# Patient Record
Sex: Female | Born: 1985 | Race: Black or African American | Hispanic: No | Marital: Single | Smoking: Never smoker
Health system: Southern US, Community
[De-identification: ages and names within clinical notes are randomized; demographics above are authoritative.]

## PROBLEM LIST (undated history)

## (undated) DIAGNOSIS — R7303 Prediabetes: Secondary | ICD-10-CM

## (undated) DIAGNOSIS — R51 Headache: Secondary | ICD-10-CM

## (undated) DIAGNOSIS — R519 Headache, unspecified: Secondary | ICD-10-CM

## (undated) DIAGNOSIS — M419 Scoliosis, unspecified: Secondary | ICD-10-CM

## (undated) DIAGNOSIS — E7800 Pure hypercholesterolemia, unspecified: Secondary | ICD-10-CM

## (undated) DIAGNOSIS — T7840XA Allergy, unspecified, initial encounter: Secondary | ICD-10-CM

## (undated) DIAGNOSIS — N200 Calculus of kidney: Secondary | ICD-10-CM

## (undated) DIAGNOSIS — K259 Gastric ulcer, unspecified as acute or chronic, without hemorrhage or perforation: Secondary | ICD-10-CM

## (undated) DIAGNOSIS — D649 Anemia, unspecified: Secondary | ICD-10-CM

## (undated) DIAGNOSIS — K219 Gastro-esophageal reflux disease without esophagitis: Secondary | ICD-10-CM

## (undated) HISTORY — DX: Prediabetes: R73.03

## (undated) HISTORY — DX: Pure hypercholesterolemia, unspecified: E78.00

## (undated) HISTORY — DX: Gastric ulcer, unspecified as acute or chronic, without hemorrhage or perforation: K25.9

## (undated) HISTORY — DX: Calculus of kidney: N20.0

## (undated) HISTORY — DX: Allergy, unspecified, initial encounter: T78.40XA

---

## 2012-07-01 ENCOUNTER — Ambulatory Visit: Payer: PRIVATE HEALTH INSURANCE

## 2012-07-01 ENCOUNTER — Ambulatory Visit (INDEPENDENT_AMBULATORY_CARE_PROVIDER_SITE_OTHER): Payer: PRIVATE HEALTH INSURANCE | Admitting: Family Medicine

## 2012-07-01 VITALS — BP 117/77 | HR 80 | Temp 98.1°F | Resp 16 | Ht 66.0 in | Wt 189.4 lb

## 2012-07-01 DIAGNOSIS — R1013 Epigastric pain: Secondary | ICD-10-CM

## 2012-07-01 DIAGNOSIS — R51 Headache: Secondary | ICD-10-CM

## 2012-07-01 MED ORDER — SUCRALFATE 1 G PO TABS: 1.0000 g | ORAL_TABLET | Freq: Four times a day (QID) | ORAL | Status: DC

## 2012-07-01 MED ORDER — TRAMADOL HCL 50 MG PO TABS: 50.0000 mg | ORAL_TABLET | Freq: Three times a day (TID) | ORAL | Status: DC | PRN

## 2012-07-01 MED ORDER — GI COCKTAIL ~~LOC~~: 30.0000 mL | Freq: Once | ORAL | Status: DC

## 2012-07-01 NOTE — Progress Notes (Signed)
[facility] [address], [place] [address] [phone]  Date:  07/01/2012   Name:  Catherine [name]   DOB:  [birth year]   MRN:  [id number]  PCP:  No primary provider on file.    Chief Complaint: Abdominal Pain and Headache   History of Present Illness:  Catherine [name] is a 27 y.o. very pleasant female patient who presents with the following:  She has noted pain in her epigastric area which seems to go "right through to her back."  This has been constant for the last 3 days. No cough.  She has noted frequent headaches.  No fever, chills or body aches.    She is eating normally- eating does not change her symptoms.    She has never had this problem in the past.  She is otherwise generally healthy.    LMP ended about 3 days ago.    She notes that she has heaches frequently- maybe 3x a week.  She has noted this problem for about 6 months and admits to taking a BC powder most days of the week.    She has never been a smoker.    There is no problem list on file for this patient.   History reviewed. No pertinent past medical history.  History reviewed. No pertinent past surgical history.  History  Substance Use Topics  . Smoking status: Never Smoker   . Smokeless tobacco: Not on file  . Alcohol Use: No    History reviewed. No pertinent family history.  No Known Allergies  Medication list has been reviewed and updated.  No current outpatient prescriptions on file prior to visit.    Review of Systems:  As per HPI- otherwise negative.   Physical Examination: Filed Vitals:   07/01/12 1318  BP: 117/77  Pulse: 80  Temp: 98.1 F (36.7 C)  Resp: 16   Filed Vitals:   07/01/12 1318  Height: 5\' 6"  (1.676 m)  Weight: 189 lb 6.4 oz (85.911 kg)   Body mass index is 30.57 kg/(m^2). Ideal Body Weight: Weight in (lb) to have BMI = 25: 154.6   GEN: WDWN, NAD, Non-toxic, A & O x 3, overweight but healthy in appearance HEENT:  Atraumatic, Normocephalic. Neck supple. No masses, No LAD. Bilateral TM wnl, oropharynx normal.  PEERL,EOMI.   Ears and Nose: No external deformity. CV: RRR, No M/G/R. No JVD. No thrill. No extra heart sounds. PULM: CTA B, no wheezes, crackles, rhonchi. No retractions. No resp. distress. No accessory muscle use. ABD: S, ND, +BS. No rebound. No HSM. She is tender over her epigastrium.  EXTR: No c/c/e NEURO Normal gait.  PSYCH: Normally interactive. Conversant. Not depressed or anxious appearing.  Calm demeanor.   [facility] reading (PRIMARY) by  Dr. Patsy Lager. Abdominal film: normal CXR 2V: negative  ABDOMEN - 1 VIEW  Comparison: None.  Findings: There is a nonobstructive bowel gas pattern. No supine evidence of free air. No organomegaly or suspicious calcification.  No acute bony abnormality.  IMPRESSION: No acute findings.  CHEST - 2 VIEW  Comparison: None.  Findings: Low lung volumes. Heart size accentuated by the low volumes. No focal opacities or effusions. No acute bony abnormality.  IMPRESSION: Low lung volumes. No acute findings.  GI cocktail: resolved her pain, tenderness in her epigastric area resolved up re- exam  Assessment and Plan: 1. Epigastric pain  DG Chest 2 View, DG Abd 1 View, gi cocktail (Maalox,Lidocaine,Donnatal), sucralfate (CARAFATE) 1 G tablet  2.  Headache  traMADol (ULTRAM) 50 MG tablet   Suspect that [name] has gastritis due to excessive use of BC powders.  Counseled her to stop use of this medication, and will use carafate and a PPI to heal her stomach.  She may use tramadol for HA if necessary.  Suspect she may be having rebound HA and encouraged her to avoid medication use if possible.  If he HA or stomach symptoms are not better in the next few days she is to let me know- Sooner if worse.     Abbe Amsterdam, MD

## 2012-07-01 NOTE — Patient Instructions (Addendum)
Stop taking BC powders!  You should also avoid advil, aleve, ibuprofen and naproxen.   Use the carafate 4x a day for the next 7- 10 days.  Also, purchase some zantac or prilosec at the drug store for your stomach.  If your stomach is not better in the next few days please let us know- Sooner if worse.   Try to avoid taking medication for your headaches as using medication may prolong the headache cycle. However, if you must take something try tylenol.  If tylenol is not helpful you can take a tramadol, but be aware they can cause drowsiness.    Avoid spicy or acidic foods until your stomach is better

## 2012-10-04 ENCOUNTER — Ambulatory Visit (INDEPENDENT_AMBULATORY_CARE_PROVIDER_SITE_OTHER): Payer: PRIVATE HEALTH INSURANCE | Admitting: Physician Assistant

## 2012-10-04 VITALS — BP 108/78 | HR 76 | Temp 98.1°F | Resp 18 | Ht 64.5 in | Wt 189.0 lb

## 2012-10-04 DIAGNOSIS — R05 Cough: Secondary | ICD-10-CM

## 2012-10-04 DIAGNOSIS — J069 Acute upper respiratory infection, unspecified: Secondary | ICD-10-CM

## 2012-10-04 DIAGNOSIS — R059 Cough, unspecified: Secondary | ICD-10-CM

## 2012-10-04 MED ORDER — AZITHROMYCIN 250 MG PO TABS: ORAL_TABLET | ORAL | Status: DC

## 2012-10-04 MED ORDER — IPRATROPIUM BROMIDE 0.06 % NA SOLN: 2.0000 | Freq: Three times a day (TID) | NASAL | Status: DC

## 2012-10-04 MED ORDER — HYDROCODONE-HOMATROPINE 5-1.5 MG/5ML PO SYRP: ORAL_SOLUTION | ORAL | Status: DC

## 2012-10-04 NOTE — Progress Notes (Signed)
Patient ID: Catherine [name] MRN: [id number], DOB: May 09, 1986, 27 y.o. Date of Encounter: 10/04/2012, 10:47 AM  Primary Physician: No primary provider on file.  Chief Complaint:  Chief Complaint  Patient presents with  . cold sxs    2 weeks    HPI: 27 y.o. female presents with a 14 day history of nasal congestion, post nasal drip, sinus pressure, and cough. Afebrile. No chills. Nasal congestion thick and green/yellow. Cough is mildly productive of green/yellow sputum and not associated with time of day. Some shortness of breath and wheezing after coughing. Ears feel full, leading to sensation of muffled hearing. Has tried OTC cold preps without success. No GI complaints. Appetite normal. No nausea, vomiting, or diarrhea. Her sister has been sick with similar symptoms. No recent antibiotics or recent travels.   No leg trauma, sedentary periods, h/o cancer, or tobacco use.  Past Medical History  Diagnosis Date  . Allergy      Home Meds: Prior to Admission medications   Medication Sig Start Date End Date Taking? Authorizing Provider  Fexofenadine-Pseudoephedrine (ALLEGRA-D PO) Take by mouth.   Yes Historical Provider, MD                       sucralfate (CARAFATE) 1 G tablet Take 1 tablet (1 g total) by mouth 4 (four) times daily. 07/01/12   Gwenlyn Found Copland, MD  traMADol (ULTRAM) 50 MG tablet Take 1 tablet (50 mg total) by mouth every 8 (eight) hours as needed for pain. 07/01/12   Pearline Cables, MD    Allergies: No Known Allergies  History   Social History  . Marital Status: Single    Spouse Name: N/A    Number of Children: N/A  . Years of Education: N/A   Occupational History  . Not on file.   Social History Main Topics  . Smoking status: Never Smoker   . Smokeless tobacco: Not on file  . Alcohol Use: No  . Drug Use: No  . Sexually Active: No   Other Topics Concern  . Not on file   Social History Narrative  . No narrative on file     Review of  Systems: Constitutional: negative for chills, fever, night sweats or weight changes HEENT: see above Cardiovascular: negative for chest pain or palpitations Respiratory: positive for cough, SOB, and wheezing. negative for hemoptysis and dyspnea  Abdominal: negative for abdominal pain, nausea, vomiting or diarrhea Dermatological: negative for rash Neurologic: positive for headache   Physical Exam: Blood pressure 108/78, pulse 76, temperature 98.1 F (36.7 C), temperature source Oral, resp. rate 18, height 5' 4.5" (1.638 m), weight 189 lb (85.73 kg), last menstrual period 09/27/2012, SpO2 100.00%., Body mass index is 31.95 kg/(m^2). General: Well developed, well nourished, in no acute distress. Head: Normocephalic, atraumatic, eyes without discharge, sclera non-icteric, nares are congested. Bilateral auditory canals clear, TM's are without perforation, pearly grey with reflective cone of light bilaterally. Serous effusion bilaterally behind TM's. Maxillary sinus TTP. Oral cavity moist, dentition normal. Posterior pharynx with post nasal drip and mild erythema. No peritonsillar abscess or tonsillar exudate. Neck: Supple. No thyromegaly. Full ROM. No lymphadenopathy. Lungs: Coarse breath sounds bilaterally without wheezes, rales, or rhonchi. Breathing is unlabored.  Heart: RRR with S1 S2. No murmurs, rubs, or gallops appreciated. Msk:  Strength and tone normal for age. Extremities: No clubbing or cyanosis. No edema. Neuro: Alert and oriented X 3. Moves all extremities spontaneously. CNII-XII grossly in tact. Psych:  Responds to questions appropriately with a normal affect.     ASSESSMENT AND PLAN:  27 y.o. female with sinobronchitis -Azithromycin 250 MG #6 2 po first day then 1 po next 4 days no RF -Atrovent NS 0.06% 2 sprays each nare bid prn #1 no RF -Hycodan #4oz 1 tsp po q 4-6 hours prn cough no RF SED -Mucinex -Tylenol/Motrin prn -Rest/fluids -RTC precautions -RTC 3-5 days if no  improvement  Signed, Eula Listen, PA-C 10/04/2012 10:47 AM

## 2012-12-15 ENCOUNTER — Ambulatory Visit (INDEPENDENT_AMBULATORY_CARE_PROVIDER_SITE_OTHER): Payer: PRIVATE HEALTH INSURANCE | Admitting: Family Medicine

## 2012-12-15 ENCOUNTER — Ambulatory Visit: Payer: PRIVATE HEALTH INSURANCE

## 2012-12-15 VITALS — BP 105/74 | HR 98 | Temp 98.3°F | Resp 17 | Ht 61.0 in | Wt 187.0 lb

## 2012-12-15 DIAGNOSIS — R079 Chest pain, unspecified: Secondary | ICD-10-CM

## 2012-12-15 DIAGNOSIS — M549 Dorsalgia, unspecified: Secondary | ICD-10-CM

## 2012-12-15 DIAGNOSIS — R0789 Other chest pain: Secondary | ICD-10-CM

## 2012-12-15 DIAGNOSIS — R071 Chest pain on breathing: Secondary | ICD-10-CM

## 2012-12-15 LAB — POCT CBC
Lymph, poc: 2.8 (ref 0.6–3.4)
MCH, POC: 26.9 pg — AB (ref 27–31.2)
MCHC: 30.4 g/dL — AB (ref 31.8–35.4)
MCV: 88.5 fL (ref 80–97)
MID (cbc): 0.4 (ref 0–0.9)
POC LYMPH PERCENT: 53.2 %L — AB (ref 10–50)
Platelet Count, POC: 202 10*3/uL (ref 142–424)
RDW, POC: 15 %
WBC: 5.3 10*3/uL (ref 4.6–10.2)

## 2012-12-15 LAB — POCT SEDIMENTATION RATE: POCT SED RATE: 31 mm/hr — AB (ref 0–22)

## 2012-12-15 MED ORDER — DICLOFENAC SODIUM 75 MG PO TBEC: 75.0000 mg | DELAYED_RELEASE_TABLET | Freq: Two times a day (BID) | ORAL | Status: DC

## 2012-12-15 NOTE — Progress Notes (Signed)
Subjective: 27 year old lady from [place] who is here with complaint of having pain in her upper chest and back. He goes through the chest cavity. She's had pain in the past last year. She denies any injury. She does not know of any straining or stressing. She denies excessive stress at the job or in life. She exercises with some running about once a week. No reflux. No sore throat.  Objective: Mildly overweight African female in no major acute distress. Her throat is clear. Neck supple without significant nodes. Chest is clear to auscultation. Heart regular without murmurs. She has mild chest wall tenderness anteriorly. Abdomen soft without mass or tenderness.  Assessment: Upper back and anterior chest pain, etiology undetermined  Plan: Check CBC, sedimentation rate, chest x-ray  [facility] reading (PRIMARY) by  Dr. Alwyn Ren Normal chest  Results for orders placed in visit on 12/15/12  POCT CBC      Result Value Range   WBC 5.3  4.6 - 10.2 K/uL   Lymph, poc 2.8  0.6 - 3.4   POC LYMPH PERCENT 53.2 (*) 10 - 50 %L   MID (cbc) 0.4  0 - 0.9   POC MID % 7.5  0 - 12 %M   POC Granulocyte 2.1  2 - 6.9   Granulocyte percent 39.3  37 - 80 %G   RBC 4.84  4.04 - 5.48 M/uL   Hemoglobin 13.0  12.2 - 16.2 g/dL   HCT, POC 16.1  09.6 - 47.9 %   MCV 88.5  80 - 97 fL   MCH, POC 26.9 (*) 27 - 31.2 pg   MCHC 30.4 (*) 31.8 - 35.4 g/dL   RDW, POC 04.5     Platelet Count, POC 202  142 - 424 K/uL   MPV 11.5  0 - 99.8 fL   .Assessment: Nonspecific chest wall pain

## 2012-12-15 NOTE — Patient Instructions (Signed)
Take diclofenac one twice daily for pain and inflammation  Return if not improving or if worse at anytime

## 2012-12-17 ENCOUNTER — Encounter: Payer: Self-pay | Admitting: Family Medicine

## 2013-12-13 ENCOUNTER — Ambulatory Visit (INDEPENDENT_AMBULATORY_CARE_PROVIDER_SITE_OTHER): Payer: PRIVATE HEALTH INSURANCE | Admitting: Family Medicine

## 2013-12-13 ENCOUNTER — Encounter: Payer: Self-pay | Admitting: Family Medicine

## 2013-12-13 VITALS — BP 126/88 | HR 81 | Temp 98.0°F | Resp 16 | Ht 65.0 in | Wt 189.0 lb

## 2013-12-13 DIAGNOSIS — Z0000 Encounter for general adult medical examination without abnormal findings: Secondary | ICD-10-CM

## 2013-12-13 DIAGNOSIS — N644 Mastodynia: Secondary | ICD-10-CM

## 2013-12-13 DIAGNOSIS — Z23 Encounter for immunization: Secondary | ICD-10-CM

## 2013-12-13 LAB — CBC
HEMATOCRIT: 38.4 % (ref 36.0–46.0)
HEMOGLOBIN: 12.9 g/dL (ref 12.0–15.0)
MCH: 27.2 pg (ref 26.0–34.0)
MCHC: 33.6 g/dL (ref 30.0–36.0)
MCV: 80.8 fL (ref 78.0–100.0)
Platelets: 213 10*3/uL (ref 150–400)
RBC: 4.75 MIL/uL (ref 3.87–5.11)
RDW: 14 % (ref 11.5–15.5)
WBC: 4.7 10*3/uL (ref 4.0–10.5)

## 2013-12-13 LAB — COMPREHENSIVE METABOLIC PANEL
ALT: 20 U/L (ref 0–35)
AST: 19 U/L (ref 0–37)
Albumin: 4.2 g/dL (ref 3.5–5.2)
Alkaline Phosphatase: 43 U/L (ref 39–117)
BUN: 10 mg/dL (ref 6–23)
CHLORIDE: 106 meq/L (ref 96–112)
CO2: 24 meq/L (ref 19–32)
Calcium: 9.4 mg/dL (ref 8.4–10.5)
Creat: 0.77 mg/dL (ref 0.50–1.10)
GLUCOSE: 81 mg/dL (ref 70–99)
POTASSIUM: 4.4 meq/L (ref 3.5–5.3)
Sodium: 143 mEq/L (ref 135–145)
TOTAL PROTEIN: 7.5 g/dL (ref 6.0–8.3)
Total Bilirubin: 0.3 mg/dL (ref 0.2–1.2)

## 2013-12-13 LAB — TSH: TSH: 1.509 u[IU]/mL (ref 0.350–4.500)

## 2013-12-13 LAB — LIPID PANEL
CHOL/HDL RATIO: 4.8 ratio
Cholesterol: 306 mg/dL — ABNORMAL HIGH (ref 0–200)
HDL: 64 mg/dL (ref >39)
LDL CALC: 229 mg/dL — AB (ref 0–99)
TRIGLYCERIDES: 66 mg/dL (ref <150)
VLDL: 13 mg/dL (ref 0–40)

## 2013-12-13 NOTE — Progress Notes (Signed)
[facility] [address], [place] [address] [phone]  Date:  12/13/2013   Name:  Catherine [name]   DOB:  [birth year]   MRN:  [id number]  PCP:  No PCP Per Patient    Chief Complaint: Annual Exam   History of Present Illness:  Catherine [name] is a 28 y.o. very pleasant female patient who presents with the following:  Generally healthy young woman here today for an annual exam.  She notes breast tenderness for about 6 months.  It is sometimes related to her cycles but not always.  Can be one breast or both, the left is worse.  She had breast discharge as a child, but none more recently.  No known family history of breast cancer.   She would like to do a pap smear- she has never had one in the past.   She is interested in losing some weight. Would like to lose about 20 lbs LMP was 2 or 3 weeks ago.  She has never had intercourse in her life.  She has not had a pelvic exam or used tampons   There are no active problems to display for this patient.   Past Medical History  Diagnosis Date  . Allergy     History reviewed. No pertinent past surgical history.  History  Substance Use Topics  . Smoking status: Never Smoker   . Smokeless tobacco: Not on file  . Alcohol Use: No    Family History  Problem Relation Age of Onset  . Hypertension Mother     No Known Allergies  Medication list has been reviewed and updated.  Current Outpatient Prescriptions on File Prior to Visit  Medication Sig Dispense Refill  . acetaminophen (TYLENOL) 325 MG tablet Take 650 mg by mouth every 6 (six) hours as needed for pain.      Marland Kitchen diclofenac (VOLTAREN) 75 MG EC tablet Take 1 tablet (75 mg total) by mouth 2 (two) times daily.  30 tablet  1   No current facility-administered medications on file prior to visit.    Review of Systems:  As per HPI- otherwise negative.   Physical Examination: Filed Vitals:   12/13/13 1023  BP: 126/88  Pulse: 81  Temp: 98 F  (36.7 C)  Resp: 16   Filed Vitals:   12/13/13 1023  Height: 5\' 5"  (1.651 m)  Weight: 189 lb (85.73 kg)   Body mass index is 31.45 kg/(m^2). Ideal Body Weight: Weight in (lb) to have BMI = 25: 149.9  GEN: WDWN, NAD, Non-toxic, A & O x 3, overweight, looks well HEENT: Atraumatic, Normocephalic. Neck supple. No masses, No LAD.  Bilateral TM wnl, oropharynx normal.  PEERL,EOMI.   Ears and Nose: No external deformity. CV: RRR, No M/G/R. No JVD. No thrill. No extra heart sounds. PULM: CTA B, no wheezes, crackles, rhonchi. No retractions. No resp. distress. No accessory muscle use. ABD: S, NT, ND. No rebound. No HSM. EXTR: No c/c/e NEURO Normal gait.  PSYCH: Normally interactive. Conversant. Not depressed or anxious appearing.  Calm demeanor.  Breast: normal exam, no masses/ dimpling/ discharge Pelvic: normal external genitalia.  Attempted pap but pt had pain with speculum insertion so we stopped   Assessment and Plan: Physical exam - Plan: CBC, Comprehensive metabolic panel, Lipid panel, TSH, Tdap vaccine greater than or equal to 7yo IM  Breast pain - Plan: MM Digital Diagnostic Bilat  CPE as above.  Update tdap.  She is of age for a  pap, but have never been SA and had pain with exam.  She will try using some small tampons and we will try her pap again in a few months.   Breast exam is normal- however given her concern will schedule a mammogram to ensure all is well Will plan further follow- up pending labs.  See patient instructions for more details.     Signed Lamar Blinks, MD

## 2013-12-13 NOTE — Patient Instructions (Signed)
You may want to try using "junior absorbency" tampons to become used to pressure in your vagina.  If this goes well we can try doing a pap for you again in a few months.  I would like to get a pap for you in the next year if possible.   I will be in touch with your labs, and we will set up a mammogram appointment for you and will give you a call.

## 2013-12-14 ENCOUNTER — Other Ambulatory Visit: Payer: Self-pay | Admitting: Family Medicine

## 2013-12-14 DIAGNOSIS — N644 Mastodynia: Secondary | ICD-10-CM

## 2013-12-28 ENCOUNTER — Ambulatory Visit
Admission: RE | Admit: 2013-12-28 | Discharge: 2013-12-28 | Disposition: A | Discharge status: Home / Self Care | Payer: PRIVATE HEALTH INSURANCE | Source: Ambulatory Visit | Attending: Family Medicine | Admitting: Family Medicine

## 2013-12-28 DIAGNOSIS — N644 Mastodynia: Secondary | ICD-10-CM

## 2013-12-29 ENCOUNTER — Encounter: Payer: Self-pay | Admitting: Family Medicine

## 2014-12-13 ENCOUNTER — Ambulatory Visit (INDEPENDENT_AMBULATORY_CARE_PROVIDER_SITE_OTHER): Payer: No Typology Code available for payment source | Admitting: Emergency Medicine

## 2014-12-13 VITALS — BP 102/78 | HR 96 | Temp 98.4°F | Resp 16 | Ht 65.0 in | Wt 183.0 lb

## 2014-12-13 DIAGNOSIS — J0140 Acute pansinusitis, unspecified: Secondary | ICD-10-CM

## 2014-12-13 MED ORDER — ACETAMINOPHEN-CODEINE #3 300-30 MG PO TABS: 1.0000 | ORAL_TABLET | ORAL | Status: DC | PRN

## 2014-12-13 MED ORDER — PSEUDOEPHEDRINE-GUAIFENESIN ER 60-600 MG PO TB12: 1.0000 | ORAL_TABLET | Freq: Two times a day (BID) | ORAL | Status: DC

## 2014-12-13 MED ORDER — AMOXICILLIN-POT CLAVULANATE 875-125 MG PO TABS: 1.0000 | ORAL_TABLET | Freq: Two times a day (BID) | ORAL | Status: DC

## 2014-12-13 NOTE — Progress Notes (Signed)
Subjective:  Patient ID: Catherine [name], female    DOB: 1986/06/02  Age: 29 y.o. MRN: [id number]  CC: Headache   HPI Catherine [name] presents   With headache. Catherine has a headache on the right side of Catherine head. Catherine denies any pulsing or throbbing. Catherine thinks Catherine might have migraine. Catherine denies any blurred vision double vision. Has no history of head injury or antecedent illness. Has no  Blurred or double vision. He has no numbness weakness in Catherine extremities. Has no difficulty expressing Catherine thoughts. Has slurred speech. Facial asymmetry. Catherine has no neck pain or stiffness. No fever or chills. Catherine does have some nasal congestion Catherine drainage in the right side.  History Catherine [name] has a past medical history of Allergy.   Catherine [name].   Catherine  family history includes Hypertension in Catherine [name].  Catherine   reports that Catherine has never [name]. Catherine does not have any smokeless tobacco history on [name]. Catherine reports that Catherine does not drink alcohol or use illicit drugs.  Outpatient Prescriptions Prior to Visit  Medication Sig Dispense Refill  . acetaminophen (TYLENOL) 325 MG tablet Take 650 mg by mouth every 6 (six) hours as needed for pain.    Marland Kitchen diclofenac (VOLTAREN) 75 MG EC tablet Take 1 tablet (75 mg total) by mouth 2 (two) times daily. (Patient not taking: Reported on 12/13/2014) 30 tablet 1   No facility-administered medications prior to visit.    History   Social History  . Marital Status: Single    Spouse Name: N/A  . Number of Children: N/A  . Years of Education: N/A   Social History Main Topics  . Smoking status: Never Smoker   . Smokeless tobacco: Not on [name]  . Alcohol Use: No  . Drug Use: No  . Sexual Activity: No   Other Topics Concern  . None   Social History Narrative     Review of Systems  Constitutional: Negative for fever, chills Catherine appetite change.  HENT: Negative for congestion, ear pain, postnasal drip, sinus pressure Catherine sore  throat.   Eyes: Negative for pain Catherine redness.  Respiratory: Negative for cough, shortness of breath Catherine wheezing.   Cardiovascular: Negative for leg swelling.  Gastrointestinal: Negative for nausea, vomiting, abdominal pain, diarrhea, constipation Catherine blood in stool.  Endocrine: Negative for polyuria.  Genitourinary: Negative for dysuria, urgency, frequency Catherine flank pain.  Musculoskeletal: Negative for gait problem.  Skin: Negative for rash.  Neurological: Positive for headaches. Negative for dizziness, speech difficulty, weakness Catherine numbness.  Psychiatric/Behavioral: Negative for confusion Catherine decreased concentration. The patient is not nervous/anxious.     Objective:  BP 102/78 mmHg  Pulse 96  Temp(Src) 98.4 F (36.9 C) (Oral)  Resp 16  Ht 5\' 5"  (1.651 m)  Wt 183 lb (83.008 kg)  BMI 30.45 kg/m2  SpO2 98%  LMP 12/10/2014  Physical Exam  Constitutional: Catherine is oriented to person, place, Catherine time. Catherine appears well-developed Catherine well-nourished.  HENT:  Head: Normocephalic Catherine atraumatic.  Nose: Mucosal edema Catherine rhinorrhea present.  Eyes: Conjunctivae Catherine EOM are normal. Pupils are equal, round, Catherine reactive to light. Lids are everted Catherine swept, no foreign bodies found.  Pulmonary/Chest: Effort normal.  Musculoskeletal: Catherine exhibits no edema.  Neurological: Catherine is alert Catherine oriented to person, place, Catherine time. Catherine has normal strength. No cranial nerve deficit or sensory deficit. Catherine displays a negative Romberg sign. Coordination Catherine gait normal.  Skin: Skin  is dry.  Psychiatric: Catherine has a normal mood Catherine affect. Catherine behavior is normal. Thought content normal.      Assessment & Plan:   [name] was seen today for headache.  Diagnoses Catherine all orders for this visit:  Acute pansinusitis, recurrence not specified  Other orders -     [name] (AUGMENTIN) 875-125 MG per tablet; Take 1 tablet by mouth 2 (two) times daily. -     Catherine [name]  (MUCINEX D) 60-600 MG per tablet; Take 1 tablet by mouth every 12 (twelve) hours. -     [name] (TYLENOL #3) 300-30 MG per tablet; Take 1-2 tablets by mouth every 4 (four) hours as needed.   Catherine [name], Catherine [name], Catherine [name]. Catherine am also having Catherine maintain Catherine acetaminophen Catherine diclofenac.  Meds ordered this encounter  Medications  . [name] (AUGMENTIN) 875-125 MG per tablet    Sig: Take 1 tablet by mouth 2 (two) times daily.    Dispense:  20 tablet    Refill:  0  . Catherine [name] (MUCINEX D) 60-600 MG per tablet    Sig: Take 1 tablet by mouth every 12 (twelve) hours.    Dispense:  18 tablet    Refill:  0  . [name] (TYLENOL #3) 300-30 MG per tablet    Sig: Take 1-2 tablets by mouth every 4 (four) hours as needed.    Dispense:  20 tablet    Refill:  0    we discussed a number of etiologies for headache Catherine has purulent drainage in Catherine nostrils total we treat Catherine as a sinus infection for the improvement couple days we need to get Catherine imaged. This is not the worst headache of Catherine life.  Appropriate red flag conditions were discussed with the patient as well as actions that should be taken.  Patient expressed his understanding.  Follow-up: Return if symptoms worsen or fail to improve.  Roselee Culver, MD

## 2014-12-13 NOTE — Patient Instructions (Signed)

## 2015-06-17 ENCOUNTER — Ambulatory Visit (INDEPENDENT_AMBULATORY_CARE_PROVIDER_SITE_OTHER): Payer: BLUE CROSS/BLUE SHIELD

## 2015-06-17 ENCOUNTER — Ambulatory Visit (INDEPENDENT_AMBULATORY_CARE_PROVIDER_SITE_OTHER): Payer: BLUE CROSS/BLUE SHIELD | Admitting: Physician Assistant

## 2015-06-17 VITALS — BP 116/70 | HR 86 | Temp 98.3°F | Resp 18 | Ht 65.0 in | Wt 189.0 lb

## 2015-06-17 DIAGNOSIS — R1900 Intra-abdominal and pelvic swelling, mass and lump, unspecified site: Secondary | ICD-10-CM

## 2015-06-17 DIAGNOSIS — R109 Unspecified abdominal pain: Secondary | ICD-10-CM

## 2015-06-17 DIAGNOSIS — Z23 Encounter for immunization: Secondary | ICD-10-CM

## 2015-06-17 LAB — POCT URINALYSIS DIP (MANUAL ENTRY)
BILIRUBIN UA: NEGATIVE
BILIRUBIN UA: NEGATIVE
Blood, UA: NEGATIVE
GLUCOSE UA: NEGATIVE
LEUKOCYTES UA: NEGATIVE
NITRITE UA: NEGATIVE
PROTEIN UA: NEGATIVE
Spec Grav, UA: 1.025
Urobilinogen, UA: 0.2
pH, UA: 6

## 2015-06-17 LAB — POCT CBC
Granulocyte percent: 33.6 %G — AB (ref 37–80)
HCT, POC: 34.8 % — AB (ref 37.7–47.9)
HEMOGLOBIN: 11.5 g/dL — AB (ref 12.2–16.2)
Lymph, poc: 2.8 (ref 0.6–3.4)
MCH, POC: 26.5 pg — AB (ref 27–31.2)
MCHC: 33.1 g/dL (ref 31.8–35.4)
MCV: 80.1 fL (ref 80–97)
MID (CBC): 0.4 (ref 0–0.9)
MPV: 8.8 fL (ref 0–99.8)
POC GRANULOCYTE: 1.6 — AB (ref 2–6.9)
POC LYMPH PERCENT: 57.5 %L — AB (ref 10–50)
POC MID %: 8.9 %M (ref 0–12)
Platelet Count, POC: 196 10*3/uL (ref 142–424)
RBC: 4.34 M/uL (ref 4.04–5.48)
RDW, POC: 16.1 %
WBC: 4.8 10*3/uL (ref 4.6–10.2)

## 2015-06-17 LAB — POC MICROSCOPIC URINALYSIS (UMFC)

## 2015-06-17 LAB — HCG, SERUM, QUALITATIVE: Preg, Serum: NEGATIVE

## 2015-06-17 LAB — POCT URINE PREGNANCY: Preg Test, Ur: NEGATIVE

## 2015-06-17 NOTE — Progress Notes (Signed)
06/17/2015 3:36 PM   DOB: 1985/09/30 / MRN: [id number]  SUBJECTIVE:  Catherine [name] is a 30 y.o. female presenting for Reportabdominal pain that started 1 month ago.  Reports that the pain is on and off, and feels like menstrual cramp. She does not take birth control, and reports the pain is worse with menstruation. She does not consume alcohol. She denies nausea, emesis.  She does report using the bathroom only one last week and thought she may be constipated, however for the last three days she has had normal stools.    She has never had a pap smear and has an appointment with an GYN next month for this.  Denies vaginal discharge and odor.  She reports her periods have been heavier over the last 4-5 months, and she is now five days late.    She has No Known Allergies.   She  has a past medical history of Allergy.    She  reports that she has never smoked. She does not have any smokeless tobacco history on file. She reports that she does not drink alcohol or use illicit drugs. She  reports that she does not engage in sexual activity. The patient  has no past surgical history on file.  Her family history includes Hypertension in her mother.  Review of Systems  Constitutional: Negative for fever and chills.  Eyes: Negative for blurred vision.  Respiratory: Negative for cough and shortness of breath.   Cardiovascular: Negative for chest pain.  Gastrointestinal: Negative for nausea and abdominal pain.  Genitourinary: Negative for dysuria, urgency and frequency.  Musculoskeletal: Negative for myalgias.  Skin: Negative for rash.  Neurological: Negative for dizziness, tingling and headaches.  Psychiatric/Behavioral: Negative for depression. The patient is not nervous/anxious.     Problem list and medications reviewed and updated by myself where necessary, and exist elsewhere in the encounter.   OBJECTIVE:  BP 116/70 mmHg  Pulse 86  Temp(Src) 98.3 F (36.8 C) (Oral)  Resp 18  Ht 5'  5" (1.651 m)  Wt 189 lb (85.73 kg)  BMI 31.45 kg/m2  SpO2 98%  LMP 05/17/2015  Physical Exam  Constitutional: She is oriented to person, place, and time. She appears well-nourished. No distress.  Eyes: EOM are normal. Pupils are equal, round, and reactive to light.  Cardiovascular: Normal rate.   Pulmonary/Chest: Effort normal.  Abdominal: She exhibits no distension.  Neurological: She is alert and oriented to person, place, and time. No cranial nerve deficit. Gait normal.  Skin: Skin is dry. She is not diaphoretic.  Psychiatric: She has a normal mood and affect.  Vitals reviewed.   Results for orders placed or performed in visit on 06/17/15 (from the past 48 hour(s))  POCT CBC     Status: Abnormal   Collection Time: 06/17/15  1:02 PM  Result Value Ref Range   WBC 4.8 4.6 - 10.2 K/uL   Lymph, poc 2.8 0.6 - 3.4   POC LYMPH PERCENT 57.5 (A) 10 - 50 %L   MID (cbc) 0.4 0 - 0.9   POC MID % 8.9 0 - 12 %M   POC Granulocyte 1.6 (A) 2 - 6.9   Granulocyte percent 33.6 (A) 37 - 80 %G   RBC 4.34 4.04 - 5.48 M/uL   Hemoglobin 11.5 (A) 12.2 - 16.2 g/dL   HCT, POC 34.8 (A) 37.7 - 47.9 %   MCV 80.1 80 - 97 fL   MCH, POC 26.5 (A) 27 - 31.2 pg  MCHC 33.1 31.8 - 35.4 g/dL   RDW, POC 16.1 %   Platelet Count, POC 196 142 - 424 K/uL   MPV 8.8 0 - 99.8 fL  POCT urinalysis dipstick     Status: None   Collection Time: 06/17/15  1:02 PM  Result Value Ref Range   Color, UA yellow yellow   Clarity, UA clear clear   Glucose, UA negative negative   Bilirubin, UA negative negative   Ketones, POC UA negative negative   Spec Grav, UA 1.025    Blood, UA negative negative   pH, UA 6.0    Protein Ur, POC negative negative   Urobilinogen, UA 0.2    Nitrite, UA Negative Negative   Leukocytes, UA Negative Negative  POCT urine pregnancy     Status: None   Collection Time: 06/17/15  1:04 PM  Result Value Ref Range   Preg Test, Ur Negative Negative  POCT Microscopic Urinalysis ([facility])     Status:  Abnormal   Collection Time: 06/17/15  1:05 PM  Result Value Ref Range   WBC,UR,HPF,POC None None WBC/hpf   RBC,UR,HPF,POC None None RBC/hpf   Bacteria None None, Too numerous to count   Mucus Present (A) Absent   Epithelial Cells, UR Per Microscopy Few (A) None, Too numerous to count cells/hpf    ASSESSMENT AND PLAN  [place] was seen today for abdominal pain, flank pain and flu vaccine.  Diagnoses and all orders for this visit:  Flank pain: Her urine is unremarkable and there appears to be a non contributory viral process on CBC. She is not pregnant, however given that her uterus feels firm and enlarged will double check this with serum.  Rads negative for a fetus, as well as anything other than stool.  I have advised that she start a fiber regimen. Will ultrasound the mass in her belly sometime this week to learn more.  Advised that she keep her GYN appointment for next month.   -     POCT CBC -     POCT urinalysis dipstick -     POCT Microscopic Urinalysis ([facility]) -     POCT urine pregnancy -     DG Abd 2 Views; Future  Needs flu shot -     Flu Vaccine QUAD 36+ mos IM  Abdominal swelling, mass, or lump -     DG Abd 2 Views; Future -     hCG, serum, qualitative -     US Pelvis Complete; Future    The patient was advised to call or return to clinic if she does not see an improvement in symptoms or to seek the care of the closest emergency department if she worsens with the above plan.   Philis Fendt, MHS, PA-C [facility] 06/17/2015 3:36 PM

## 2015-06-17 NOTE — Patient Instructions (Signed)
Influenza Virus Vaccine injection What is this medicine? INFLUENZA VIRUS VACCINE (in floo EN zuh VAHY ruhs vak SEEN) helps to reduce the risk of getting influenza also known as the flu. The vaccine only helps protect you against some strains of the flu. This medicine may be used for other purposes; ask your health care Mc Bloodworth or pharmacist if you have questions. What should I tell my health care Talor Cheema before I take this medicine? They need to know if you have any of these conditions: -bleeding disorder like hemophilia -fever or infection -Guillain-Barre syndrome or other neurological problems -immune system problems -infection with the human immunodeficiency virus (HIV) or AIDS -low blood platelet counts -multiple sclerosis -an unusual or allergic reaction to influenza virus vaccine, latex, other medicines, foods, dyes, or preservatives. Different brands of vaccines contain different allergens. Some may contain latex or eggs. Talk to your doctor about your allergies to make sure that you get the right vaccine. -pregnant or trying to get pregnant -breast-feeding How should I use this medicine? This vaccine is for injection into a muscle or under the skin. It is given by a health care professional. A copy of Vaccine Information Statements will be given before each vaccination. Read this sheet carefully each time. The sheet may change frequently. Talk to your healthcare Afiya Ferrebee to see which vaccines are right for you. Some vaccines should not be used in all age groups. Overdosage: If you think you have taken too much of this medicine contact a poison control center or emergency room at once. NOTE: This medicine is only for you. Do not share this medicine with others. What if I miss a dose? This does not apply. What may interact with this medicine? -chemotherapy or radiation therapy -medicines that lower your immune system like etanercept, anakinra, infliximab, and adalimumab -medicines  that treat or prevent blood clots like warfarin -phenytoin -steroid medicines like prednisone or cortisone -theophylline -vaccines This list may not describe all possible interactions. Give your health care Kyrra Prada a list of all the medicines, herbs, non-prescription drugs, or dietary supplements you use. Also tell them if you smoke, drink alcohol, or use illegal drugs. Some items may interact with your medicine. What should I watch for while using this medicine? Report any side effects that do not go away within 3 days to your doctor or health care professional. Call your health care Ariza Evans if any unusual symptoms occur within 6 weeks of receiving this vaccine. You may still catch the flu, but the illness is not usually as bad. You cannot get the flu from the vaccine. The vaccine will not protect against colds or other illnesses that may cause fever. The vaccine is needed every year. What side effects may I notice from receiving this medicine? Side effects that you should report to your doctor or health care professional as soon as possible: -allergic reactions like skin rash, itching or hives, swelling of the face, lips, or tongue Side effects that usually do not require medical attention (report to your doctor or health care professional if they continue or are bothersome): -fever -headache -muscle aches and pains -pain, tenderness, redness, or swelling at the injection site -tiredness This list may not describe all possible side effects. Call your doctor for medical advice about side effects. You may report side effects to FDA at 1-800-FDA-1088. Where should I keep my medicine? The vaccine will be given by a health care professional in a clinic, pharmacy, doctor's office, or other health care setting. You will not   be given vaccine doses to store at home. NOTE: This sheet is a summary. It may not cover all possible information. If you have questions about this medicine, talk to your  doctor, pharmacist, or health care Desteni Piscopo.    2016, Elsevier/Gold Standard. (2014-12-21 10:07:28)  

## 2015-06-18 ENCOUNTER — Other Ambulatory Visit: Payer: Self-pay | Admitting: Physician Assistant

## 2015-06-18 DIAGNOSIS — R1900 Intra-abdominal and pelvic swelling, mass and lump, unspecified site: Secondary | ICD-10-CM

## 2015-06-19 ENCOUNTER — Ambulatory Visit
Admission: RE | Admit: 2015-06-19 | Discharge: 2015-06-19 | Disposition: A | Discharge status: Home / Self Care | Payer: BLUE CROSS/BLUE SHIELD | Source: Ambulatory Visit | Attending: Physician Assistant | Admitting: Physician Assistant

## 2015-06-19 DIAGNOSIS — R1900 Intra-abdominal and pelvic swelling, mass and lump, unspecified site: Secondary | ICD-10-CM

## 2015-06-26 ENCOUNTER — Other Ambulatory Visit: Payer: Self-pay | Admitting: Physician Assistant

## 2015-06-26 DIAGNOSIS — D259 Leiomyoma of uterus, unspecified: Secondary | ICD-10-CM

## 2015-06-26 NOTE — Progress Notes (Signed)
Please go ahead and put in the referral.

## 2015-06-26 NOTE — Progress Notes (Signed)
Spoke with patient to see if she had any questions.  She has none and would like to be referred to GYN. Philis Fendt, MS, PA-C 1:29 PM, 06/26/2015

## 2015-07-02 ENCOUNTER — Ambulatory Visit: Payer: BLUE CROSS/BLUE SHIELD | Admitting: Gynecology

## 2015-07-02 ENCOUNTER — Ambulatory Visit (INDEPENDENT_AMBULATORY_CARE_PROVIDER_SITE_OTHER): Payer: BLUE CROSS/BLUE SHIELD | Admitting: Gynecology

## 2015-07-02 ENCOUNTER — Other Ambulatory Visit (HOSPITAL_COMMUNITY)
Admission: RE | Admit: 2015-07-02 | Discharge: 2015-07-02 | Disposition: A | Discharge status: Home / Self Care | Payer: BLUE CROSS/BLUE SHIELD | Source: Ambulatory Visit | Attending: Gynecology | Admitting: Gynecology

## 2015-07-02 ENCOUNTER — Encounter: Payer: Self-pay | Admitting: Gynecology

## 2015-07-02 VITALS — BP 118/74 | Ht 65.0 in | Wt 188.0 lb

## 2015-07-02 DIAGNOSIS — Z01419 Encounter for gynecological examination (general) (routine) without abnormal findings: Secondary | ICD-10-CM | POA: Insufficient documentation

## 2015-07-02 DIAGNOSIS — D251 Intramural leiomyoma of uterus: Secondary | ICD-10-CM | POA: Diagnosis not present

## 2015-07-02 DIAGNOSIS — N920 Excessive and frequent menstruation with regular cycle: Secondary | ICD-10-CM | POA: Diagnosis not present

## 2015-07-02 DIAGNOSIS — Z1151 Encounter for screening for human papillomavirus (HPV): Secondary | ICD-10-CM | POA: Insufficient documentation

## 2015-07-02 MED ORDER — NORETHINDRONE ACET-ETHINYL EST 1-20 MG-MCG PO TABS: 1.0000 | ORAL_TABLET | Freq: Every day | ORAL | Status: DC

## 2015-07-02 NOTE — Progress Notes (Signed)
Catherine [name] [birth year] [id number]        29 y.o.  Catherine [name] patient for first GYN exam. Is virginal and reports never having a GYN exam or Pap smear.  [facility] and was found on ultrasound to have enlarged uterus 14 x 8 x 11 cm with 2 myomas one measuring 9.7 cm and one measuring 4.6 cm. Endometrial echo 6 mm. Right and left ovaries normal.  Was never told she had leiomyoma in the past. Menses are monthly relatively heavy for about 5 days. Recent hemoglobin 11.5.  Past medical history,surgical history, problem list, medications, allergies, family history and social history were all reviewed and documented as reviewed in the EPIC chart.  ROS:  Performed with pertinent positives and negatives included in the history, assessment and plan.   Additional significant findings :  none   Exam: Caryn Bee assistant Filed Vitals:   07/02/15 1548  BP: 118/74  Height: 5\' 5"  (1.651 m)  Weight: 188 lb (85.276 kg)   General appearance:  Normal affect, orientation and appearance. Skin: Grossly normal HEENT: Without gross lesions.  No cervical or supraclavicular adenopathy. Thyroid normal.  Lungs:  Clear without wheezing, rales or rhonchi Cardiac: RR, without RMG Abdominal:  Soft, nontender, without masses, guarding, rebound, organomegaly or hernia Breasts:  Without masses, retractions, discharge or axillary adenopathy. Pelvic:  Ext/BUS/vagina normal with some restriction due to virginal status  Cervix normal. Pap smear done  Uterus bulky consistent with her history of leiomyoma. Difficult to clearly outline due to voluntary guarding  Adnexa  Without gross masses or tenderness    Anus and perineum  Normal   Rectovaginal  Normal sphincter tone without palpated masses or tenderness.    Assessment/Plan:  [name] female for annual exam with regular menses, virginal status.   1. Recently discovered leiomyoma. Appears to have 2  myomas. She does have relatively heavy periods historically. Is virginal with no immediate plans for childbearing but does plan to have children someday. I reviewed the options for her leiomyoma to include observation, medical suppression such as trial of low-dose oral contraceptives, Lysteda, progesterone only, uterine artery embolization, myomectomy either open or attempted robotic up to including hysterectomy. The risks/benefits of each choice discussed. Surgical risks reviewed to include potential for transfusion. Possibility of regrowth of more leiomyoma if conservative such as myomectomy or uterine artery embolization. The issues/risks of pregnancy following these procedures also discussed.  At this point the patient does not want active intervention other than she will consider low-dose oral contraceptive trial to see if she can suppress the flow of her menses. Possible every other to every third month withdrawal option discussed. Loestrin 1/20 equipment prescribed. Risks to include thrombosis discussed. Will call this does not seem to lessen her menses. Need to follow up with repeat ultrasound in 6 months to relook at her leiomyoma was discussed to make sure that they're not significantly enlarging. Patient agrees to follow up for this. She'll call me if she wants to pursue alternative treatment.  I also recommended she start on daily OTC iron supplement she going go ahead and do so. 2. First Pap smear done today. Plan repeat at 3 year interval is normal. 3. Breast self. SBE monthly reviewed. No strong family history of breast cancer. Plan Baseline mammogram closer to 40. 4. Health maintenance. No routine lab work done as this is done through her primary physician's office. Follow up in 6 months for  repeat ultrasound, sooner if any issues or difficulties with the birth control pills.   Anastasio Auerbach MD, 4:29 PM 07/02/2015

## 2015-07-02 NOTE — Patient Instructions (Signed)
Start on the birth control pills as we discussed. Call if you have any questions or issues. Recommend following up in repeating an ultrasound in 6 months to relook at the fibroid tumors in your uterus.

## 2015-07-02 NOTE — Addendum Note (Signed)
Addended by: Nelva Nay on: 07/02/2015 04:41 PM   Modules accepted: Orders

## 2015-07-04 LAB — CYTOLOGY - PAP

## 2015-10-18 DIAGNOSIS — M25561 Pain in right knee: Secondary | ICD-10-CM | POA: Diagnosis not present

## 2015-12-13 ENCOUNTER — Ambulatory Visit (INDEPENDENT_AMBULATORY_CARE_PROVIDER_SITE_OTHER): Payer: BLUE CROSS/BLUE SHIELD | Admitting: Physician Assistant

## 2015-12-13 ENCOUNTER — Ambulatory Visit (INDEPENDENT_AMBULATORY_CARE_PROVIDER_SITE_OTHER): Payer: BLUE CROSS/BLUE SHIELD

## 2015-12-13 VITALS — BP 104/68 | HR 93 | Temp 97.8°F | Resp 18 | Ht 65.0 in | Wt 188.8 lb

## 2015-12-13 DIAGNOSIS — M546 Pain in thoracic spine: Secondary | ICD-10-CM | POA: Diagnosis not present

## 2015-12-13 DIAGNOSIS — M545 Low back pain: Secondary | ICD-10-CM | POA: Diagnosis not present

## 2015-12-13 DIAGNOSIS — M419 Scoliosis, unspecified: Secondary | ICD-10-CM | POA: Diagnosis not present

## 2015-12-13 LAB — POCT URINE PREGNANCY: PREG TEST UR: NEGATIVE

## 2015-12-13 MED ORDER — NAPROXEN 500 MG PO TABS: 500.0000 mg | ORAL_TABLET | Freq: Two times a day (BID) | ORAL | Status: DC

## 2015-12-13 NOTE — Patient Instructions (Signed)
     IF you received an x-ray today, you will receive an invoice from [facility]. Please contact [facility] at [phone] with questions or concerns regarding your invoice.   IF you received labwork today, you will receive an invoice from [facility]. Please contact [facility] at [phone] with questions or concerns regarding your invoice.   Our billing staff will not be able to assist you with questions regarding bills from these companies.  You will be contacted with the lab results as soon as they are available. The fastest way to get your results is to activate your My Chart account. Instructions are located on the last page of this paperwork. If you have not heard from us regarding the results in 2 weeks, please contact this office.      

## 2015-12-13 NOTE — Progress Notes (Signed)
12/13/2015 12:16 PM   DOB: [birth year] / MRN: [id number]  SUBJECTIVE:  Catherine [name] is a 30 y.o. female presenting for the evaluation of gradually worsening "achy" midline thoracic back pain that started 2 months ago. Associated symptoms include no other symptoms, and she denies weakness, numbness, tingling, dysuria, leg pain.Treatments tried thus far include Tylenol. She denies fever, nausea, dysuria, frequency and urgency.   She has No Known Allergies.   She  has a past medical history of Allergy.    She  reports that she has never smoked. She does not have any smokeless tobacco history on file. She reports that she drinks alcohol. She reports that she does not use illicit drugs. She  reports that she does not engage in sexual activity. The patient  has no past surgical history on file.  Her family history includes Hypertension in her mother.  Review of Systems  Constitutional: Negative for fever and chills.  Eyes: Negative for blurred vision.  Respiratory: Negative for cough and shortness of breath.   Cardiovascular: Negative for chest pain.  Gastrointestinal: Negative for nausea and abdominal pain.  Genitourinary: Negative for dysuria, urgency and frequency.  Musculoskeletal: Negative for myalgias.  Skin: Negative for rash.  Neurological: Negative for dizziness, tingling and headaches.  Psychiatric/Behavioral: Negative for depression. The patient is not nervous/anxious.     Problem list and medications reviewed and updated by myself where necessary, and exist elsewhere in the encounter.   OBJECTIVE:  BP 104/68 mmHg  Pulse 93  Temp(Src) 97.8 F (36.6 C) (Oral)  Resp 18  Ht 5\' 5"  (1.651 m)  Wt 188 lb 12.8 oz (85.639 kg)  BMI 31.42 kg/m2  SpO2 98%  LMP 11/25/2015 CrCl cannot be calculated (Patient has no serum creatinine result on file.).  Physical Exam  Constitutional: She is oriented to person, place, and time. She appears well-nourished. No distress.  Eyes: EOM  are normal. Pupils are equal, round, and reactive to light.  Cardiovascular: Normal rate.   Pulmonary/Chest: Effort normal.  Abdominal: She exhibits no distension.  Neurological: She is alert and oriented to person, place, and time. She has normal strength and normal reflexes. No cranial nerve deficit or sensory deficit. Coordination and gait normal.  Skin: Skin is dry. She is not diaphoretic.  Psychiatric: She has a normal mood and affect.  Vitals reviewed.   Results for orders placed or performed in visit on 12/13/15 (from the past 48 hour(s))  POCT urine pregnancy     Status: None   Collection Time: 12/13/15 11:42 AM  Result Value Ref Range   Preg Test, Ur Negative Negative    Dg Thoracic Spine 2 View  12/13/2015  CLINICAL DATA:  Thoracic pain.  No known injury . EXAM: THORACIC SPINE 2 VIEWS COMPARISON:  12/15/2012. FINDINGS: No acute bony abnormality identified. 9 degree scoliosis upper thoracic spine concave right. 7 degree mid to lower lower thoracic spine concave left. No acute bony abnormality. IMPRESSION: 9 degree upper thoracic spine concave right. 7 degree mid to lower thoracic spine scoliosis concave left. No acute bony abnormality. Electronically Signed   By: Marcello Moores  Register   On: 12/13/2015 11:59   Dg Lumbar Spine Complete  12/13/2015  CLINICAL DATA:  Low back pain for 2 months without known injury. EXAM: LUMBAR SPINE - COMPLETE 4+ VIEW COMPARISON:  None. FINDINGS: There is no evidence of lumbar spine fracture. Alignment is normal. Intervertebral disc spaces are maintained. IMPRESSION: Normal lumbar spine. Electronically Signed   By: Sabino Dick  Brooke Bonito, M.D.   On: 12/13/2015 11:58    ASSESSMENT AND PLAN  Lolly was seen today for back pain.  Diagnoses and all orders for this visit:  Midline thoracic back pain -     DG Thoracic Spine 2 View; Future -     DG Lumbar Spine Complete; Future -     POCT urine pregnancy  Scoliosis deformity of spine -     Ambulatory referral  to Physical Therapy -     naproxen (NAPROSYN) 500 MG tablet; Take 1 tablet (500 mg total) by mouth 2 (two) times daily with a meal.    The patient was advised to call or return to clinic if she does not see an improvement in symptoms or to seek the care of the closest emergency department if she worsens with the above plan.   Philis Fendt, MHS, PA-C [facility] 12/13/2015 12:16 PM

## 2015-12-18 ENCOUNTER — Encounter: Payer: Self-pay | Admitting: Gynecology

## 2015-12-18 ENCOUNTER — Ambulatory Visit (INDEPENDENT_AMBULATORY_CARE_PROVIDER_SITE_OTHER): Payer: BLUE CROSS/BLUE SHIELD | Admitting: Gynecology

## 2015-12-18 ENCOUNTER — Other Ambulatory Visit: Payer: Self-pay | Admitting: Gynecology

## 2015-12-18 ENCOUNTER — Ambulatory Visit (INDEPENDENT_AMBULATORY_CARE_PROVIDER_SITE_OTHER): Payer: BLUE CROSS/BLUE SHIELD

## 2015-12-18 VITALS — BP 124/82 | Ht 65.0 in | Wt 188.0 lb

## 2015-12-18 DIAGNOSIS — N852 Hypertrophy of uterus: Secondary | ICD-10-CM | POA: Diagnosis not present

## 2015-12-18 DIAGNOSIS — R102 Pelvic and perineal pain: Secondary | ICD-10-CM

## 2015-12-18 DIAGNOSIS — N946 Dysmenorrhea, unspecified: Secondary | ICD-10-CM

## 2015-12-18 DIAGNOSIS — D500 Iron deficiency anemia secondary to blood loss (chronic): Secondary | ICD-10-CM | POA: Diagnosis not present

## 2015-12-18 DIAGNOSIS — D251 Intramural leiomyoma of uterus: Secondary | ICD-10-CM

## 2015-12-18 LAB — CBC WITH DIFFERENTIAL/PLATELET
BASOS ABS: 41 {cells}/uL (ref 0–200)
BASOS PCT: 1 %
EOS PCT: 2 %
Eosinophils Absolute: 82 cells/uL (ref 15–500)
HCT: 34 % — ABNORMAL LOW (ref 35.0–45.0)
HEMOGLOBIN: 11 g/dL — AB (ref 11.7–15.5)
LYMPHS ABS: 2337 {cells}/uL (ref 850–3900)
Lymphocytes Relative: 57 %
MCH: 25.9 pg — AB (ref 27.0–33.0)
MCHC: 32.4 g/dL (ref 32.0–36.0)
MCV: 80.2 fL (ref 80.0–100.0)
MPV: 12.1 fL (ref 7.5–12.5)
Monocytes Absolute: 369 cells/uL (ref 200–950)
Monocytes Relative: 9 %
NEUTROS ABS: 1271 {cells}/uL — AB (ref 1500–7800)
Neutrophils Relative %: 31 %
Platelets: 208 10*3/uL (ref 140–400)
RBC: 4.24 MIL/uL (ref 3.80–5.10)
RDW: 15.6 % — ABNORMAL HIGH (ref 11.0–15.0)
WBC: 4.1 10*3/uL (ref 3.8–10.8)

## 2015-12-18 NOTE — Patient Instructions (Signed)
Office will call you to arrange surgery. 

## 2015-12-18 NOTE — Progress Notes (Signed)
    Bobbiejean Leasure 03-22-1986 [id number]        30 y.o.  G0P0000 presents for follow up ultrasound.  Was diagnosed with leiomyoma this past January with ultrasound showing uterine length 14 cm with a 9+ centimeter myoma and a 4+ meter myomas. Was having heavy menses and anemia with hemoglobin in the 11 range. Was started on low-dose oral contraceptives having been counseled for options and patient electing for expectant management. Notes irregular bleeding with spotting in between her menses as well as progressive pelvic pain now with discomfort almost daily on and off in the lower pelvis pressure and cramping. Remains virginal. Has thought about the options we discussed previously and wants to proceed with myomectomy.  Past medical history,surgical history, problem list, medications, allergies, family history and social history were all reviewed and documented in the EPIC chart.  Directed ROS with pertinent positives and negatives documented in the history of present illness/assessment and plan.  Exam: Filed Vitals:   12/18/15 0920  BP: 124/82  Height: 5\' 5"  (1.651 m)  Weight: 188 lb (85.276 kg)   General appearance:  Normal Abdomen soft nontender with palpable mass 1-2 finger breaths below umbilicus consistent with her leiomyoma.  Ultrasound shows enlarged uterus 160 mm length. Endometrial echo 4.7 mm. Multiple myomas measured largest 12 cm. Others 6.4 cm, 5.2 cm, 5.0 cm, 4.3 cm, 2.7 cm, 2.4 cm. Right and left ovaries visualized and normal. 36 x 34 mm fluid in cul-de-sac.  Assessment/Plan:  [name] with multiple myomas questionable enlargement from prior study. Worsening pelvic pain and irregular bleeding on low-dose oral contraceptives. Patient has decided that she wants to proceed with myomectomy due to her pelvic discomfort. I reviewed the procedure with her to include attempted robotic versus open. Issues to include multiple myomas with attempt to remove all visual myomas but  no guarantee that she will not have recurrence of her myomas in the future and given the number that she has now probability that she will regrow myomas in the future. Multiple uterine incisions that may lead to infertility issues due to scarring as well as pregnancy risks due to expanding uterus and risk of rupture either before or during labor. The issues of her anemia and risks of transfusion ring the procedure and the catastrophic event of hysterectomy in a nulliparous patient due to uncontrolled hemorrhage necessitating hysterectomy. All of this was reviewed with the patient and she understands and accepts this. She wants to proceed with an open myomectomy. Will check baseline CBC now. Recommended continuous oral contraceptives for now for menstrual suppression and I also discussed the possibilities of Depo-Lupron if significant anemia for hemoglobin recovery and possible shrinkage.    Anastasio Auerbach MD, 9:49 AM 12/18/2015

## 2015-12-24 ENCOUNTER — Ambulatory Visit: Payer: BLUE CROSS/BLUE SHIELD | Attending: Physician Assistant | Admitting: Physical Therapy

## 2015-12-24 DIAGNOSIS — M545 Low back pain, unspecified: Secondary | ICD-10-CM

## 2015-12-24 DIAGNOSIS — R293 Abnormal posture: Secondary | ICD-10-CM | POA: Insufficient documentation

## 2015-12-24 DIAGNOSIS — M6281 Muscle weakness (generalized): Secondary | ICD-10-CM | POA: Diagnosis not present

## 2015-12-24 DIAGNOSIS — M546 Pain in thoracic spine: Secondary | ICD-10-CM | POA: Diagnosis not present

## 2015-12-24 NOTE — Therapy (Signed)
[place], [place], [address] Phone: [phone]   Fax:  [phone]  Physical Therapy Evaluation  Patient Details  Name: Catherine [name] MRN: [id number] Date of Birth: 07/05/1985 Referring Provider: Philis Fendt PA  Encounter Date: 12/24/2015      PT End of Session - 12/24/15 1010    Visit Number 1   Number of Visits 12   Date for PT Re-Evaluation 02/04/16   Authorization Type BCBS   Authorization Time Period 02-04-16   PT Start Time 0930   PT Stop Time 1015   PT Time Calculation (min) 45 min   Activity Tolerance Patient tolerated treatment well   Behavior During Therapy [facility] for tasks assessed/performed      Past Medical History  Diagnosis Date  . Allergy     No past surgical history on file.  There were no vitals filed for this visit.       Subjective Assessment - 12/24/15 0939    Subjective I didnt know I had scoliosis in my mid back mostly at night.  I started at 8/10   at end of RX 6/10 bothers when sleeping at night   Limitations Walking;Standing;Sitting;House hold activities;Lifting   How long can you sit comfortably? 10 min   How long can you stand comfortably? 10 min   How long can you walk comfortably? 10 min   Diagnostic tests x ray    Patient Stated Goals relieve mid back pain   Currently in Pain? Yes   Pain Score 8    Pain Location Back  thoracic   Pain Orientation Right;Left  right > Left   Pain Descriptors / Indicators Sharp   Pain Type Chronic pain   Pain Onset More than a month ago   Pain Frequency Intermittent   Aggravating Factors  sleeping, sitting and standing for long time   Pain Relieving Factors nothing so far            [facility] PT Assessment - 12/24/15 0943    Assessment   Medical Diagnosis Mid back pain, thoracic   Referring Provider Philis Fendt PA   Onset Date/Surgical Date 09/24/15  about 3 months ago   Hand Dominance Right   Prior  Therapy none   Precautions   Precautions None   Restrictions   Weight Bearing Restrictions No   Balance Screen   Has the patient fallen in the past 6 months No   Has the patient had a decrease in activity level because of a fear of falling?  No   Is the patient reluctant to leave their home because of a fear of falling?  No   Home Environment   Living Environment Private residence   Living Arrangements Parent   Type of [place] to enter   Entrance Stairs-Number of Steps 2   Entrance Stairs-Rails None   Home Layout Two level   Prior Function   Level of Independence Independent   Architect  CNA   Vocation Requirements CNA, lifting patients   Cognition   Overall Cognitive Status Within Functional Limits for tasks assessed   Observation/Other Assessments   Observations Pt healthy young woman   Focus on Therapeutic Outcomes (FOTO)  intake 60 limitation 40%  predicted 24%   Posture/Postural Control   Posture/Postural Control Postural limitations   Postural Limitations Rounded Shoulders;Forward head;Anterior pelvic tilt   Posture Comments right pelvic level is higher than left  tightened Quadratus lumborum  Thoracic t7 to t-11 more pronounced on right   AROM   Lumbar Flexion 80   Lumbar Extension 20   Lumbar - Right Side Bend 25   Lumbar - Left Side Bend 20   Lumbar - Right Rotation 90%   Lumbar - Left Rotation 50%  right side tightness   Strength   Overall Strength Deficits   Overall Strength Comments abdominal muscles 3/5 unable to hold    Right Hip Flexion 4+/5   Right Hip ABduction 4/5   Left Hip Flexion 4+/5   Left Hip ABduction 4/5   Flexibility   Soft Tissue Assessment /Muscle Length yes   Hamstrings Right 70, left 60   Palpation   Palpation comment tenderness over right facets of T-7 through 10                   [facility] Adult PT Treatment/Exercise - 12/24/15 0943    Self-Care   Self-Care Posture   Posture sitting and  standing posture   Lumbar Exercises: Stretches   Quadruped Mid Back Stretch 3 reps;30 seconds   Quadruped Mid Back Stretch Limitations relieves pain in thoracic region   Lumbar Exercises: Seated   Other Seated Lumbar Exercises Thoracic self mobilization with a towel hold 10 seconds x 5   Lumbar Exercises: Supine   Other Supine Lumbar Exercises thoracic self mobilztion stretch 5-10 second hold x 5   Lumbar Exercises: Sidelying   Other Sidelying Lumbar Exercises Quadratus lumborum left sidelying over  towel roll for 5 minutes with vc and tc for correct alignment   gave handout with positioning                PT Education - 12/24/15 1011    Education provided Yes   Education Details POC , explanation of findings. posture and intial HEP   Person(s) Educated Patient   Methods Explanation;Demonstration;Tactile cues;Verbal cues;Handout   Comprehension Verbalized understanding;Returned demonstration             PT Long Term Goals - 12/24/15 1241    PT LONG TERM GOAL #1   Title "Demonstrate and verbalize techniques to reduce the risk of re-injury including: lifting, posture, body mechanics   Time 6   Period Weeks   Status New   PT LONG TERM GOAL #2   Title "Pt will be independent with advanced HEP.    Time 6   Period Weeks   Status New   PT LONG TERM GOAL #3   Title "Pain will decrease to </= 2/10 with all functional activities   Time 6   Period Weeks   Status New   PT LONG TERM GOAL #4   Title Pt will be able to utilize positioning and pain management strategies in order to sleep 4 or more hours uninterrupted at night   Time 6   Period Weeks   Status New   PT LONG TERM GOAL #5   Title Pt will be able to tolerate walking and standing for 1 hour in order to complete household chores more comfortably   Time 6   Period Weeks   Status New   PT LONG TERM GOAL #6   Title "FOTO will improve from  40 % limitation   to 24%    indicating improved functional mobility.     Time 6   Period Weeks   Status New               Plan - 12/24/15 1022  Clinical Impression Statement 30 yo femaile presents with low complexity eval with low back and thoracic pain due to scoliosis.  Pt has more pain at night  and has difficulty standing, sitting and walking longer than 10 minutes due to mid back pain.  Pt presents with deficits including, pain, muscle weakness , abnormal posture, core weakness and decreased AROM, elevated right pelvic level and tightend Right quadratus lumborum.  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   PT Frequency 2x / week   PT Duration 6 weeks   PT Treatment/Interventions ADLs/Self Care Home Management;Cryotherapy;Electrical Stimulation;Ultrasound;Moist Heat;Iontophoresis 4mg /ml Dexamethasone;Therapeutic exercise;Neuromuscular re-education;Patient/family education;Manual techniques;Dry needling;Passive range of motion;Taping   PT Next Visit Plan pre pilates and possibel DN for scoliosis in thoracic region   PT Home Exercise Plan thoracic stretches and posture sitting and standing   Consulted and Agree with Plan of Care Patient      Patient will benefit from skilled therapeutic intervention in order to improve the following deficits and impairments:  Decreased endurance, Decreased range of motion, Increased muscle spasms, Increased fascial restricitons, Decreased strength, Hypomobility, Impaired flexibility, Postural dysfunction, Pain, Improper body mechanics  Visit Diagnosis: Midline low back pain without sciatica  Muscle weakness (generalized)  Abnormal posture  Pain in thoracic spine     Problem List There are no active problems to display for this patient.   Voncille Lo, PT 12/24/2015 12:51 PM Phone: [phone] Fax: [phone]  By  signing I understand that I am ordering/authorizing the use of Iontophoresis using 4 mg/mL of dexamethasone as a component of this plan of care.  [place], [place], [address] Phone: [phone]   Fax:  [phone]  Name: Arina Checchi MRN: [id number] Date of Birth: [birth year]

## 2015-12-24 NOTE — Patient Instructions (Signed)
Thoracic Self-Mobilization (Sitting)   With small rolled towel at lower ribs level, gently lean back until stretch is felt. Hold _5-10___ seconds. Relax. Repeat _5__ times per set. Do _1___ sets per session. Do _2___ sessions per day.  http://orth.exer.us/998   Copyright  VHI. All rights reserved.  Thoracic Self-Mobilization Stretch (Supine)   With small rolled towel at lower ribs level, gently lie back until stretch is felt. Hold _5___ seconds. Relax. Repeat ____ times per set. Do ____ sets per session. Do ____ sessions per day.  http://orth.exer.us/994     Flexion   Sitting on knees, fold body over legs and relax head and arms on floor. Extend arms as far forward as possible. Hold _30___ seconds. Repeat _2-3___ times. Do _1-2___ sessions per day.  Pt given left sidelying quadratus lumborum stretch on handout to use at home. Lie for 15 minutes while watching TV  Copyright  VHI. All rights reserved.   Posture Tips DO: - stand tall and erect - keep chin tucked in - keep head and shoulders in alignment - check posture regularly in mirror or large window - pull head back against headrest in car seat;  Change your position often.  Sit with lumbar support. DON'T: - slouch or slump while watching TV or reading - sit, stand or lie in one position  for too long;  Sitting is especially hard on the spine so if you sit at a desk/use the computer, then stand up often!   Copyright  VHI. All rights reserved.  Posture - Standing   Good posture is important. Avoid slouching and forward head thrust. Maintain curve in low back and align ears over shoul- ders, hips over ankles.  Pull your belly button in toward your back bone. Stand on your heels and toes, even wt.  Belly button to spine. Ribs lifted up . Chin down as shown in clinic.  [place].  No [employer]  No military   Copyright  VHI. All rights reserved.  Posture - Sitting   Sit upright, head facing forward. Try using a roll to  support lower back. Keep shoulders relaxed, and avoid rounded back. Keep hips level with knees. Avoid crossing legs for long periods. Sit all the way back in chair.  Sit on sit bones not your tail bone.   Copyright  VHI. All rights reserved.  Posture Tips DO: - stand tall and erect - keep chin tucked in - keep head and shoulders in alignment - check posture regularly in mirror or large window - pull head back against headrest in car seat;  Change your position often.  Sit with lumbar support. DON'T: - slouch or slump while watching TV or reading - sit, stand or lie in one position  for too long;  Sitting is especially hard on the spine so if you sit at a desk/use the computer, then stand up often!   Voncille Lo, PT 12/24/2015 10:00 AM Phone: [phone] Fax: [phone]

## 2015-12-26 ENCOUNTER — Encounter: Payer: Self-pay | Admitting: Gynecology

## 2015-12-31 ENCOUNTER — Ambulatory Visit: Payer: BLUE CROSS/BLUE SHIELD | Admitting: Physical Therapy

## 2015-12-31 DIAGNOSIS — M545 Low back pain, unspecified: Secondary | ICD-10-CM

## 2015-12-31 DIAGNOSIS — M6281 Muscle weakness (generalized): Secondary | ICD-10-CM | POA: Diagnosis not present

## 2015-12-31 DIAGNOSIS — R293 Abnormal posture: Secondary | ICD-10-CM | POA: Diagnosis not present

## 2015-12-31 DIAGNOSIS — M546 Pain in thoracic spine: Secondary | ICD-10-CM | POA: Diagnosis not present

## 2015-12-31 NOTE — Therapy (Signed)
[place], [place], [address] Phone: [phone]   Fax:  [phone]  Physical Therapy Treatment  Patient Details  Name: Catherine [name] MRN: [id number] Date of Birth: 1985-09-29 Referring Provider: Philis Fendt PA  Encounter Date: 12/31/2015      PT End of Session - 12/31/15 0942    Visit Number 2   Date for PT Re-Evaluation 02/04/16   Authorization Type BCBS   Authorization Time Period 02-04-16   PT Start Time 0935   PT Stop Time 1024   PT Time Calculation (min) 49 min   Activity Tolerance Patient tolerated treatment well   Behavior During Therapy [facility] for tasks assessed/performed      Past Medical History  Diagnosis Date  . Allergy     No past surgical history on file.  There were no vitals filed for this visit.      Subjective Assessment - 12/31/15 0937    Subjective Right now I am a 6/10 in my back , but I did better with my exericise   Limitations Walking;Standing;Sitting;House hold activities;Lifting   How long can you sit comfortably? 10 min   How long can you stand comfortably? 10 min   How long can you walk comfortably? 10 min   Diagnostic tests x ray    Patient Stated Goals relieve mid back pain   Currently in Pain? Yes   Pain Score 6    Pain Location Back  thoracic   Pain Orientation Right;Left  right > Left   Pain Descriptors / Indicators Sharp   Pain Type Chronic pain   Pain Onset More than a month ago   Pain Frequency Intermittent                         [facility] Adult PT Treatment/Exercise - 12/31/15 0947    Lumbar Exercises: Supine   Ab Set 10 reps;Other (comment)  ffor 10 sec   Clam 10 reps  right and left leg   Clam Limitations Pt needs VC/ TC for correct execution   Heel Slides 10 reps;Other (comment)  right and left    Heel Slides Limitations VC for breathing   Bent Knee Raise 10 reps;Other (comment)  right and left leg each   Bent Knee  Raise Limitations VC for correct breathing technique   Other Supine Lumbar Exercises Pelvic tilt 2 x 10   Modalities   Modalities Moist Heat   Moist Heat Therapy   Number Minutes Moist Heat 15 Minutes   Moist Heat Location Lumbar Spine;Other (comment)  thoracic right   Manual Therapy   Manual Therapy Soft tissue mobilization   Soft tissue mobilization  soft tissue and IASTYM tool right thoracic and lumbar Quadratus lumborum bil          Trigger Point Dry Needling - 12/31/15 0943    Consent Given? Yes   Education Handout Provided Yes   Muscles Treated Upper Body Rhomboids;Longissimus   Muscles Treated Lower Body --  right side only over rib braced 7/8   Rhomboids Response --   Longissimus Response Twitch response elicited;Palpable increased muscle length              PT Education - 12/31/15 0941    Education provided Yes   Education Details Pre pilates for HEP and info on trigger point dry needling precautians and aftercare   Person(s) Educated Patient   Methods Explanation;Demonstration;Tactile cues;Verbal cues;Handout   Comprehension Verbalized understanding;Returned  demonstration             PT Long Term Goals - 12/31/15 [id number]    PT LONG TERM GOAL #1   Title "Demonstrate and verbalize techniques to reduce the risk of re-injury including: lifting, posture, body mechanics   Time 6   Period Weeks   Status On-going   PT LONG TERM GOAL #2   Title "Pt will be independent with advanced HEP.    Time 6   Period Weeks   Status On-going   PT LONG TERM GOAL #3   Title "Pain will decrease to </= 2/10 with all functional activities   Time 6   Period Weeks   Status On-going   PT LONG TERM GOAL #4   Title Pt will be able to utilize positioning and pain management strategies in order to sleep 4 or more hours uninterrupted at night   Time 6   Period Weeks   Status On-going   PT LONG TERM GOAL #5   Title Pt will be able to tolerate walking and standing for 1 hour in  order to complete household chores more comfortably   Time 6   Period Weeks   Status On-going   PT LONG TERM GOAL #6   Title "FOTO will improve from  40 % limitation   to 24%    indicating improved functional mobility.    Time 6   Period Weeks   Status On-going               Plan - 12/31/15 [id number]    Clinical Impression Statement Pt returns for 2nd visit with 6/10 pain and has felt improvement with exericises.  Pt consents to trigger point dry needling for specific trigger point pain in thoracic and lower rhomboid area.  Pt with decreased pain level after treatment . Pt rib braced on right t 7-8 and was decreased in pain 4/10 post treatment   Rehab Potential Excellent   PT Frequency 2x / week   PT Duration 6 weeks   PT Treatment/Interventions ADLs/Self Care Home Management;Cryotherapy;Electrical Stimulation;Ultrasound;Moist Heat;Iontophoresis 4mg /ml Dexamethasone;Therapeutic exercise;Neuromuscular re-education;Patient/family education;Manual techniques;Dry needling;Passive range of motion;Taping   PT Next Visit Plan pre pilates and possibel DN for scoliosis in thoracic region   PT Home Exercise Plan added to HEP pre pilates with thoracic stretches   Consulted and Agree with Plan of Care Patient      Patient will benefit from skilled therapeutic intervention in order to improve the following deficits and impairments:  Decreased endurance, Decreased range of motion, Increased muscle spasms, Increased fascial restricitons, Decreased strength, Hypomobility, Impaired flexibility, Postural dysfunction, Pain, Improper body mechanics  Visit Diagnosis: Midline low back pain without sciatica  Muscle weakness (generalized)  Abnormal posture  Pain in thoracic spine     Problem List There are no active problems to display for this patient.  Voncille Lo, PT 12/31/2015 10:16 AM Phone: [phone] Fax: [facility]  [address] [place], [place], [address] Phone: [phone]   Fax:  [phone]  Name: Ailena Passero MRN: [id number] Date of Birth: 1985/07/17

## 2015-12-31 NOTE — Patient Instructions (Signed)
Trigger Point Dry Needling  . What is Trigger Point Dry Needling (DN)? o DN is a physical therapy technique used to treat muscle pain and dysfunction. Specifically, DN helps deactivate muscle trigger points (muscle knots).  o A thin filiform needle is used to penetrate the skin and stimulate the underlying trigger point. The goal is for a local twitch response (LTR) to occur and for the trigger point to relax. No medication of any kind is injected during the procedure.   . What Does Trigger Point Dry Needling Feel Like?  o The procedure feels different for each individual patient. Some patients report that they do not actually feel the needle enter the skin and overall the process is not painful. Very mild bleeding may occur. However, many patients feel a deep cramping in the muscle in which the needle was inserted. This is the local twitch response.   Marland Kitchen How Will I feel after the treatment? o Soreness is normal, and the onset of soreness may not occur for a few hours. Typically this soreness does not last longer than two days.  o Bruising is uncommon, however; ice can be used to decrease any possible bruising.  o In rare cases feeling tired or nauseous after the treatment is normal. In addition, your symptoms may get worse before they get better, this period will typically not last longer than 24 hours.   . What Can I do After My Treatment? o Increase your hydration by drinking more water for the next 24 hours. o You may place ice or heat on the areas treated that have become sore, however, do not use heat on inflamed or bruised areas. Heat often brings more relief post needling. o You can continue your regular activities, but vigorous activity is not recommended initially after the treatment for 24 hours. o DN is best combined with other physical therapy such as strengthening, stretching, and other therapies.     PELVIC TILT  Lie on back, legs bent. Exhale, tilting top of pelvis back, pubic  bone up, to flatten lower back. Inhale, rolling pelvis opposite way, top forward, pubic bone down, arch in back. Repeat __10__ times. Do __2__ sessions per day. Copyright  VHI. All rights reserved.    Isometric Hold With Pelvic Floor (Hook-Lying)  Lie with hips and knees bent. Slowly inhale, and then exhale. Pull navel toward spine and tighten pelvic floor. Hold for __10_ seconds. Continue to breathe in and out during hold. Rest for _10__ seconds. Repeat __10_ times. Do __2-3_ times a day.   Knee Fold  Lie on back, legs bent, arms by sides. Exhale, lifting knee to chest. Inhale, returning. Keep abdominals flat, navel to spine. Repeat __10__ times, alternating legs. Do __2__ sessions per day.  Knee Drop  Keep pelvis stable. Without rotating hips, slowly drop knee to side, pause, return to center, bring knee across midline toward opposite hip. Feel obliques engaging. Repeat for ___10_ times each leg.   Copyright  VHI. All rights reserved.       Heel Slide to Straight   Slide one leg down to straight. Return. Be sure pelvis does not rock forward, tilt, rotate, or tip to side. Do _10__ times. Restabilize pelvis. Repeat with other leg. Do __1-2_ sets, __2_ times per day.  http://ss.exer.us/16   Copyright  VHI. All rights reserved.   Voncille Lo, PT 12/31/2015 9:41 AM Phone: [phone] Fax: [phone]

## 2016-01-03 ENCOUNTER — Ambulatory Visit: Payer: BLUE CROSS/BLUE SHIELD | Admitting: Physical Therapy

## 2016-01-03 DIAGNOSIS — R293 Abnormal posture: Secondary | ICD-10-CM

## 2016-01-03 DIAGNOSIS — M546 Pain in thoracic spine: Secondary | ICD-10-CM | POA: Diagnosis not present

## 2016-01-03 DIAGNOSIS — M545 Low back pain, unspecified: Secondary | ICD-10-CM

## 2016-01-03 DIAGNOSIS — M6281 Muscle weakness (generalized): Secondary | ICD-10-CM | POA: Diagnosis not present

## 2016-01-03 NOTE — Therapy (Signed)
[place], [place], [address] Phone: [phone]   Fax:  [phone]  Physical Therapy Treatment  Patient Details  Name: Catherine [name] MRN: [id number] Date of Birth: July 19, 1985 Referring Provider: Philis Fendt PA  Encounter Date: 01/03/2016      PT End of Session - 01/03/16 1133    Visit Number 3   Number of Visits 12   Date for PT Re-Evaluation 02/04/16   Authorization Type BCBS   Authorization Time Period 02-04-16   PT Start Time 1055   PT Stop Time 1145   PT Time Calculation (min) 50 min      Past Medical History  Diagnosis Date  . Allergy     No past surgical history on file.  There were no vitals filed for this visit.      Subjective Assessment - 01/03/16 1118    Currently in Pain? Yes   Pain Score 5    Pain Location Back  thoracic   Pain Descriptors / Indicators --  mild   Pain Frequency Intermittent   Aggravating Factors  supine and sidelying                          [facility] Adult PT Treatment/Exercise - 01/03/16 0001    Lumbar Exercises: Stretches   Quadruped Mid Back Stretch 3 reps;30 seconds   Quadruped Mid Back Stretch Limitations childs pose forward and lateral x 2 each way   Lumbar Exercises: Seated   Other Seated Lumbar Exercises Thoracic self mobilization with a towel hold 10 seconds x 5   Lumbar Exercises: Supine   Ab Set 5 reps   AB Set Limitations with breathing   Clam 10 reps  right and left leg   Heel Slides 10 reps;Other (comment)  right and left    Bent Knee Raise 10 reps;Other (comment)  right and left leg each   Other Supine Lumbar Exercises thoracic self mobilztion stretch 5-10 second hold x 5   Lumbar Exercises: Sidelying   Other Sidelying Lumbar Exercises book opening stretch 3 x 30 sec each side    Lumbar Exercises: Quadruped   Madcat/Old Horse 10 reps   Madcat/Old Horse Limitations pt reports relief of pain   Single Arm Raise  10 reps   Single Arm Raises Limitations with abdominal draw in   Modalities   Modalities Cryotherapy   Moist Heat Therapy   Number Minutes Moist Heat 10 Minutes   Moist Heat Location --  thoracic                PT Education - 01/03/16 1129    Education provided Yes   Education Details cat/camel; book openings;    Person(s) Educated Patient   Methods Explanation;Handout   Comprehension Verbalized understanding             PT Long Term Goals - 12/31/15 0952    PT LONG TERM GOAL #1   Title "Demonstrate and verbalize techniques to reduce the risk of re-injury including: lifting, posture, body mechanics   Time 6   Period Weeks   Status On-going   PT LONG TERM GOAL #2   Title "Pt will be independent with advanced HEP.    Time 6   Period Weeks   Status On-going   PT LONG TERM GOAL #3   Title "Pain will decrease to </= 2/10 with all functional activities   Time 6   Period Weeks  Status On-going   PT LONG TERM GOAL #4   Title Pt will be able to utilize positioning and pain management strategies in order to sleep 4 or more hours uninterrupted at night   Time 6   Period Weeks   Status On-going   PT LONG TERM GOAL #5   Title Pt will be able to tolerate walking and standing for 1 hour in order to complete household chores more comfortably   Time 6   Period Weeks   Status On-going   PT LONG TERM GOAL #6   Title "FOTO will improve from  40 % limitation   to 24%    indicating improved functional mobility.    Time 6   Period Weeks   Status On-going               Plan - 01/03/16 1139    Clinical Impression Statement Pt reports decreasing pain levels. Her pain is 5/10 and located mid thoracic spine. Reviewed all HEP and added book openings and cat/camel with pt reporting decreased pain to 4/10 after treatment.    PT Next Visit Plan pre pilates and possibel DN for scoliosis in thoracic region, review new thoracic stretches      Patient will benefit from  skilled therapeutic intervention in order to improve the following deficits and impairments:  Decreased endurance, Decreased range of motion, Increased muscle spasms, Increased fascial restricitons, Decreased strength, Hypomobility, Impaired flexibility, Postural dysfunction, Pain, Improper body mechanics  Visit Diagnosis: Midline low back pain without sciatica  Muscle weakness (generalized)  Abnormal posture  Pain in thoracic spine     Problem List There are no active problems to display for this patient.   Dorene Ar, [place] 01/03/2016, 11:46 AM  [facility] [address] [place], [place], [address] Phone: [phone]   Fax:  [phone]  Name: Catherine [name] MRN: [id number] Date of Birth: 1986-03-21

## 2016-01-07 ENCOUNTER — Ambulatory Visit: Payer: BLUE CROSS/BLUE SHIELD | Admitting: Physical Therapy

## 2016-01-07 DIAGNOSIS — M6281 Muscle weakness (generalized): Secondary | ICD-10-CM | POA: Diagnosis not present

## 2016-01-07 DIAGNOSIS — M545 Low back pain, unspecified: Secondary | ICD-10-CM

## 2016-01-07 DIAGNOSIS — M546 Pain in thoracic spine: Secondary | ICD-10-CM

## 2016-01-07 DIAGNOSIS — R293 Abnormal posture: Secondary | ICD-10-CM | POA: Diagnosis not present

## 2016-01-07 NOTE — Therapy (Addendum)
[employer], [place], [address] Phone: [phone]   Fax:  [phone]  Physical Therapy Treatment  Patient Details  Name: Catherine [name] MRN: [id number] Date of Birth: 1985-07-06 Referring Provider: Philis Fendt PA  Encounter Date: 01/07/2016      PT End of Session - 01/07/16 0932    Visit Number 4   Number of Visits 12   Date for PT Re-Evaluation 02/04/16   Authorization Type BCBS   Authorization Time Period 02-04-16   PT Start Time 0931   PT Stop Time 1030   PT Time Calculation (min) 59 min      Past Medical History:  Diagnosis Date  . Allergy     No past surgical history on file.  There were no vitals filed for this visit.      Subjective Assessment - 01/07/16 0933    Subjective right now I am a 4/10, harder to find my pain point. still on upper right side   Limitations Walking;Standing;Sitting;House hold activities;Lifting   How long can you sit comfortably? unlimited     How long can you stand comfortably? 60 min   How long can you walk comfortably? 60 hours   Diagnostic tests x ray    Patient Stated Goals relieve mid back pain   Currently in Pain? Yes   Pain Score 4    Pain Location Back   Pain Orientation Right   Pain Descriptors / Indicators Dull;Aching   Pain Type Chronic pain                         [facility] Adult PT Treatment/Exercise - 01/07/16 0938      Lumbar Exercises: Stretches   Quadruped Mid Back Stretch 3 reps;30 seconds   Quadruped Mid Back Stretch Limitations childs pose forward and lateral x 2 each way     Lumbar Exercises: Supine   Ab Set 10 reps;Other (comment)  ffor 10 sec   Clam 10 reps  right and left leg   Clam Limitations Pt needs VC/ TC for correct execution   Heel Slides 10 reps;Other (comment)  right and left    Heel Slides Limitations VC for breathing   Bent Knee Raise 10 reps;Other (comment)  right and left leg each   Bent  Knee Raise Limitations VC for correct breathing technique   Other Supine Lumbar Exercises thoracic self mobilztion stretch 5-10 second hold x 5 using foam roller for PA self mobs   Other Supine Lumbar Exercises Pelvic tilt 1 x 10     Lumbar Exercises: Sidelying   Other Sidelying Lumbar Exercises book opening stretch 3 x 30 sec each side   therapist timing for pt     Lumbar Exercises: Quadruped   Madcat/Old Horse 10 reps   Madcat/Old Horse Limitations no pain reported     Modalities   Modalities Moist Heat     Moist Heat Therapy   Moist Heat Location Lumbar Spine;Other (comment)  thoracic right     Iontophoresis   Type of Iontophoresis Dexamethasone   Location right medial/lower scapular area   Dose 1 cc   Time patch to be removed after 4 hours of wear/ given written info     Manual Therapy   Manual Therapy Soft tissue mobilization   Joint Mobilization right rib junction T 5,6, 7 on right   Soft tissue mobilization soft tissue and IASTYM to right thoracic and lower rhomboids on right side  Trigger Point Dry Needling - 01/07/16 0951    Consent Given? Yes   Education Handout Provided No  previously given   Muscles Treated Upper Body Rhomboids;Longissimus   Rhomboids Response Twitch response elicited;Palpable increased muscle length  T 4,5, 6,7 paraspinalright   Longissimus Response Twitch response elicited;Palpable increased muscle length  right mid lower scapula              PT Education - 01/07/16 1007    Education provided Yes   Education Details iontophoresis instructions to remove patch in 4 hours,    Person(s) Educated Patient   Methods Explanation   Comprehension Verbalized understanding             PT Long Term Goals - 12/31/15 [id number]      PT LONG TERM GOAL #1   Title "Demonstrate and verbalize techniques to reduce the risk of re-injury including: lifting, posture, body mechanics   Time 6   Period Weeks   Status On-going     PT LONG  TERM GOAL #2   Title "Pt will be independent with advanced HEP.    Time 6   Period Weeks   Status On-going     PT LONG TERM GOAL #3   Title "Pain will decrease to </= 2/10 with all functional activities   Time 6   Period Weeks   Status On-going     PT LONG TERM GOAL #4   Title Pt will be able to utilize positioning and pain management strategies in order to sleep 4 or more hours uninterrupted at night   Time 6   Period Weeks   Status On-going     PT LONG TERM GOAL #5   Title Pt will be able to tolerate walking and standing for 1 hour in order to complete household chores more comfortably   Time 6   Period Weeks   Status On-going     PT LONG TERM GOAL #6   Title "FOTO will improve from  40 % limitation   to 24%    indicating improved functional mobility.    Time 6   Period Weeks   Status On-going               Plan - 01/07/16 0945    Clinical Impression Statement Pt reports decreasing pain to 4/10 in right side of thoracic spine mid back.  Pt utilizing exercises to decreased pain at home but has a more centralized pain point at T- 6 , 7 and 8 on right paraspinally . Pt consents to trigger point dry needling and is closely monitiored throughout session.  Pt then with specific point pain T-6.T-7 area on right midscapular and recieved iontophoresis to right lowere medial scapular area.  Pt to remove in 4 hours and was given written instructions.    Rehab Potential Excellent   PT Frequency 2x / week   PT Duration 6 weeks   PT Treatment/Interventions ADLs/Self Care Home Management;Cryotherapy;Electrical Stimulation;Ultrasound;Moist Heat;Iontophoresis 4mg /ml Dexamethasone;Therapeutic exercise;Neuromuscular re-education;Patient/family education;Manual techniques;Dry needling;Passive range of motion;Taping   PT Next Visit Plan Assess goals next visit and progress core exercises.  Check iontophoresis/DN   PT Home Exercise Plan continue HEP with thoracic stretches      Patient  will benefit from skilled therapeutic intervention in order to improve the following deficits and impairments:  Decreased endurance, Decreased range of motion, Increased muscle spasms, Increased fascial restricitons, Decreased strength, Hypomobility, Impaired flexibility, Postural dysfunction, Pain, Improper body mechanics  Visit Diagnosis: Midline low back  pain without sciatica  Muscle weakness (generalized)  Abnormal posture  Pain in thoracic spine     Problem List There are no active problems to display for this patient.   Voncille Lo, PT 01/07/16 10:26 AM Phone: [phone] Fax: [facility] [address] [place], [place], [address] Phone: [phone]   Fax:  [phone]  Name: Catherine [name] MRN: [id number] Date of Birth: 28-Aug-1985

## 2016-01-07 NOTE — Patient Instructions (Signed)

## 2016-01-10 ENCOUNTER — Ambulatory Visit: Payer: BLUE CROSS/BLUE SHIELD | Admitting: Physical Therapy

## 2016-01-10 DIAGNOSIS — M546 Pain in thoracic spine: Secondary | ICD-10-CM

## 2016-01-10 DIAGNOSIS — M545 Low back pain, unspecified: Secondary | ICD-10-CM

## 2016-01-10 DIAGNOSIS — M6281 Muscle weakness (generalized): Secondary | ICD-10-CM | POA: Diagnosis not present

## 2016-01-10 DIAGNOSIS — R293 Abnormal posture: Secondary | ICD-10-CM

## 2016-01-10 NOTE — Therapy (Signed)
[place], [place], [address] Phone: [phone]   Fax:  [phone]  Physical Therapy Treatment  Patient Details  Name: Jaritza Duignan MRN: [id number] Date of Birth: 11/18/1985 Referring Provider: Philis Fendt PA  Encounter Date: 01/10/2016      PT End of Session - 01/10/16 1212    Visit Number 5   Number of Visits 12   Date for PT Re-Evaluation 02/04/16   Authorization Type BCBS   Authorization Time Period 02-04-16   PT Start Time 1056   PT Stop Time 1135   PT Time Calculation (min) 39 min      Past Medical History:  Diagnosis Date  . Allergy     No past surgical history on file.  There were no vitals filed for this visit.      Subjective Assessment - 01/10/16 1057    Subjective I feel much better. I feel good. Pain is 3/10 with palpation only.    Currently in Pain? No/denies            [facility] PT Assessment - 01/10/16 0001      Observation/Other Assessments   Focus on Therapeutic Outcomes (FOTO)  12% limited improved from 40% limitation on Eval                     [facility] Adult PT Treatment/Exercise - 01/10/16 0001      Lumbar Exercises: Stretches   Quadruped Mid Back Stretch 3 reps;30 seconds   Quadruped Mid Back Stretch Limitations childs pose forward and lateral x 2 each way     Lumbar Exercises: Aerobic   Stationary Bike Nustep L4 UE/LE a 5 minutes     Lumbar Exercises: Seated   Other Seated Lumbar Exercises Thoracic self mobilization with a towel hold 10 seconds x 5     Lumbar Exercises: Supine   Ab Set 10 reps;Other (comment)  ffor 10 sec   Clam 10 reps  right and left leg   Clam Limitations Pt needs VC/ TC for correct execution   Heel Slides 10 reps;Other (comment)  right and left    Heel Slides Limitations VC for breathing   Bent Knee Raise 10 reps;Other (comment)  right and left leg each   Bent Knee Raise Limitations VC for correct breathing  technique, increased challenge to bilateral table top with alternating toe tap x10 , also dead bug, all more challenging and pt demonstrates fatigue quickly   Other Supine Lumbar Exercises --   Other Supine Lumbar Exercises --     Lumbar Exercises: Sidelying   Other Sidelying Lumbar Exercises book opening stretch 3 x 30 sec each side      Lumbar Exercises: Quadruped   Madcat/Old Horse 10 reps   Opposite Arm/Leg Raise 5 reps;5 seconds   Opposite Arm/Leg Raise Limitations difficult, no pain     Iontophoresis   Type of Iontophoresis Dexamethasone   Location right medial/lower scapular area   Dose 1 cc   Time patch to be removed after 4 hours of wear/ given written info                     PT Long Term Goals - 01/10/16 1100      PT LONG TERM GOAL #1   Title "Demonstrate and verbalize techniques to reduce the risk of re-injury including: lifting, posture, body mechanics   Time 6   Period Weeks   Status Achieved  PT LONG TERM GOAL #2   Title "Pt will be independent with advanced HEP.    Time 6   Period Weeks   Status Achieved     PT LONG TERM GOAL #3   Title "Pain will decrease to </= 2/10 with all functional activities   Time 6   Period Weeks   Status Achieved     PT LONG TERM GOAL #4   Title Pt will be able to utilize positioning and pain management strategies in order to sleep 4 or more hours uninterrupted at night   Time 6   Period Weeks   Status Achieved     PT LONG TERM GOAL #5   Title Pt will be able to tolerate walking and standing for 1 hour in order to complete household chores more comfortably   Time 6   Period Weeks   Status Achieved     PT LONG TERM GOAL #6   Title "FOTO will improve from  40 % limitation   to 24%    indicating improved functional mobility.    Time 6   Period Weeks   Status Unable to assess               Plan - 01/10/16 1106    Clinical Impression Statement Pt reports no pan with function. She only has pain  with palpation to right upper back. She reports it as 3/10 with palpation. All goals met. Discussed probable discharge next visit if continued decrease in pain with function. Pt agrees Review of all HEP exercises as well as progression of core. Repeated Ionto patch to area of tenderness.    PT Next Visit Plan discharge if still no pain? strengthen?    PT Home Exercise Plan continue HEP with thoracic stretches      Patient will benefit from skilled therapeutic intervention in order to improve the following deficits and impairments:  Decreased endurance, Decreased range of motion, Increased muscle spasms, Increased fascial restricitons, Decreased strength, Hypomobility, Impaired flexibility, Postural dysfunction, Pain, Improper body mechanics  Visit Diagnosis: Midline low back pain without sciatica  Muscle weakness (generalized)  Abnormal posture  Pain in thoracic spine     Problem List There are no active problems to display for this patient.   [place], Jessica McGee, PTA 01/10/2016, 12:16 PM  [facility] [address] [place], [place], [address] Phone: [phone]   Fax:  [phone]  Name: Lataunya Diggs MRN: [id number] Date of Birth: 04/07/1986    

## 2016-01-14 ENCOUNTER — Ambulatory Visit: Payer: BLUE CROSS/BLUE SHIELD | Attending: Physician Assistant | Admitting: Physical Therapy

## 2016-01-14 DIAGNOSIS — R293 Abnormal posture: Secondary | ICD-10-CM | POA: Diagnosis not present

## 2016-01-14 DIAGNOSIS — M546 Pain in thoracic spine: Secondary | ICD-10-CM | POA: Diagnosis not present

## 2016-01-14 DIAGNOSIS — M6281 Muscle weakness (generalized): Secondary | ICD-10-CM | POA: Diagnosis not present

## 2016-01-14 DIAGNOSIS — M545 Low back pain, unspecified: Secondary | ICD-10-CM

## 2016-01-14 NOTE — Patient Instructions (Signed)
  Copyright  VHI. All rights reserved.  Bracing With Arm / Leg Raise (Quadruped)   On hands and knees find neutral spine. Tighten pelvic floor and abdominals and hold. Alternating, lift arm to shoulder level and opposite leg to hip level. Hold 5 seconds.  Repeat _10__ times. Do _2__ times a day.

## 2016-01-14 NOTE — Therapy (Addendum)
[place], [place], [address] Phone: [phone]   Fax:  [phone]  Physical Therapy Treatment/Discharge Note  Patient Details  Name: Catherine [name] MRN: [id number] Date of Birth: 01-Jun-1986 Referring Provider: Philis Fendt PA  Encounter Date: 01/14/2016      PT End of Session - 01/14/16 1134    Visit Number 6   Number of Visits 12   Date for PT Re-Evaluation 02/04/16   Authorization Type BCBS   Authorization Time Period 02-04-16   PT Start Time 1103   PT Stop Time 1141   PT Time Calculation (min) 38 min      Past Medical History:  Diagnosis Date  . Allergy     No past surgical history on file.  There were no vitals filed for this visit.      Subjective Assessment - 01/14/16 1135    Subjective I still feel good                         [facility] Adult PT Treatment/Exercise - 01/14/16 0001      Lumbar Exercises: Stretches   Quadruped Mid Back Stretch 3 reps;30 seconds   Quadruped Mid Back Stretch Limitations childs pose forward and lateral x 2 each way  also basket weave for rhomboid stretch 20 sec each     Lumbar Exercises: Aerobic   Stationary Bike Nustep L4 UE/LE x 7 minutes     Lumbar Exercises: Supine   Ab Set 10 reps;Other (comment)  ffor 10 sec   Clam 10 reps  right and left leg   Clam Limitations Pt needs VC/ TC for correct execution   Heel Slides 10 reps;Other (comment)  right and left    Heel Slides Limitations VC for breathing   Bent Knee Raise 10 reps;Other (comment)  right and left leg each   Bent Knee Raise Limitations VC for correct breathing technique, increased challenge to bilateral table top with alternating toe tap x10 , also dead bug, all more challenging and pt demonstrates fatigue quickly   Bridge 10 reps   Other Supine Lumbar Exercises thoracic self mobilztion stretch 5-10 second hold x 5 using foam roller for PA self mobs     Lumbar  Exercises: Sidelying   Other Sidelying Lumbar Exercises book opening stretch 3 x 30 sec each side      Lumbar Exercises: Quadruped   Madcat/Old Horse 10 reps   Opposite Arm/Leg Raise 5 reps;10 reps;5 seconds   Opposite Arm/Leg Raise Limitations some pain today right upper back     Iontophoresis   Type of Iontophoresis Dexamethasone   Location right medial/lower scapular area   Dose 1 cc   Time patch to be removed after 4 hours of wear/ given written info                PT Education - 01/14/16 1134    Education provided Yes   Education Details Quadruped UE/LE raise   Person(s) Educated Patient   Methods Explanation;Handout   Comprehension Verbalized understanding             PT Long Term Goals - 01/14/16 1136      PT LONG TERM GOAL #1   Title "Demonstrate and verbalize techniques to reduce the risk of re-injury including: lifting, posture, body mechanics   Time 6   Period Weeks   Status Achieved     PT LONG TERM GOAL #2   Title "Pt  will be independent with advanced HEP.    Time 6   Period Weeks   Status On-going     PT LONG TERM GOAL #3   Title "Pain will decrease to </= 2/10 with all functional activities   Time 6   Period Weeks   Status Achieved     PT LONG TERM GOAL #4   Title Pt will be able to utilize positioning and pain management strategies in order to sleep 4 or more hours uninterrupted at night   Time 6   Period Weeks   Status Achieved     PT LONG TERM GOAL #5   Title Pt will be able to tolerate walking and standing for 1 hour in order to complete household chores more comfortably   Time 6   Period Weeks   Status Achieved     PT LONG TERM GOAL #6   Title "FOTO will improve from  40 % limitation   to 24%    indicating improved functional mobility.    Baseline 12% limited    Time 6   Period Weeks   Status Achieved               Plan - 01/14/16 1135    Clinical Impression Statement Pt reports increased pain with UE closed  chain exercises today. She reports being sleepy today and has decreased endurance with core exercises.    PT Next Visit Plan core ; lengthening exercises      Patient will benefit from skilled therapeutic intervention in order to improve the following deficits and impairments:  Decreased endurance, Decreased range of motion, Increased muscle spasms, Increased fascial restricitons, Decreased strength, Hypomobility, Impaired flexibility, Postural dysfunction, Pain, Improper body mechanics  Visit Diagnosis: Midline low back pain without sciatica  Muscle weakness (generalized)  Abnormal posture  Pain in thoracic spine     Problem List There are no active problems to display for this patient.   Dorene Ar, [place] 01/14/2016, 12:02 PM  [facility] [address] [place], [place], [address] Phone: [phone]   Fax:  [phone]  Name: Catherine [name] MRN: [id number] Date of Birth: 03-Apr-1986  PHYSICAL THERAPY DISCHARGE SUMMARY  Visits from Start of Care: 6  Current functional level related to goals / functional outcomes: unknown   Remaining deficits: unknown   Education / Equipment: HEP Plan:                                                    Patient goals were partially met. Patient is being discharged due to not returning since the last visit.  ?????      Voncille Lo, PT [facility] Adult  05/28/16 4:54 PM Phone: [phone] Fax: [phone]

## 2016-01-16 ENCOUNTER — Ambulatory Visit (INDEPENDENT_AMBULATORY_CARE_PROVIDER_SITE_OTHER): Payer: BLUE CROSS/BLUE SHIELD | Admitting: Gynecology

## 2016-01-16 ENCOUNTER — Encounter: Payer: Self-pay | Admitting: Gynecology

## 2016-01-16 VITALS — BP 116/74 | Temp 97.2°F

## 2016-01-16 DIAGNOSIS — D251 Intramural leiomyoma of uterus: Secondary | ICD-10-CM | POA: Diagnosis not present

## 2016-01-16 DIAGNOSIS — R102 Pelvic and perineal pain: Secondary | ICD-10-CM

## 2016-01-16 LAB — URINALYSIS W MICROSCOPIC + REFLEX CULTURE
Bilirubin Urine: NEGATIVE
CASTS: NONE SEEN [LPF]
Crystals: NONE SEEN [HPF]
GLUCOSE, UA: NEGATIVE
Leukocytes, UA: NEGATIVE
NITRITE: NEGATIVE
PH: 5.5 (ref 5.0–8.0)
SPECIFIC GRAVITY, URINE: 1.015 (ref 1.001–1.035)
YEAST: NONE SEEN [HPF]

## 2016-01-16 MED ORDER — IBUPROFEN 800 MG PO TABS: 800.0000 mg | ORAL_TABLET | Freq: Three times a day (TID) | ORAL | 1 refills | Status: DC | PRN

## 2016-01-16 MED ORDER — KETOROLAC TROMETHAMINE 60 MG/2ML IM SOLN
60.0000 mg | Freq: Once | INTRAMUSCULAR | Status: AC
  Administered 2016-01-16: 60 mg via INTRAMUSCULAR

## 2016-01-16 NOTE — Patient Instructions (Signed)
Take the prescribed ibuprofen every 8 hours as needed for pain. Follow up if significant pain continues. Follow up for your scheduled preoperative consult.

## 2016-01-16 NOTE — Addendum Note (Signed)
Addended by: Nelva Nay on: 01/16/2016 03:53 PM   Modules accepted: Orders

## 2016-01-16 NOTE — Addendum Note (Signed)
Addended by: Nelva Nay on: 01/16/2016 03:17 PM   Modules accepted: Orders

## 2016-01-16 NOTE — Progress Notes (Signed)
    Catherine [name] [birth year] [id number]        30 y.o.  G0P0000 presents complaining of lower abdominal pain over the last 2 days. Significant enough that she could not go to work. History of large leiomyoma with scheduled multiple myomectomy in several weeks. Was started on low-dose oral contraceptives and notes that her bleeding is very light and regular. She is mid cycle now. No nausea vomiting diarrhea constipation. Eating without difficulty. No urinary symptoms such as frequency dysuria or urgency.  Past medical history,surgical history, problem list, medications, allergies, family history and social history were all reviewed and documented in the EPIC chart.  Directed ROS with pertinent positives and negatives documented in the history of present illness/assessment and plan.  Exam: Caryn Bee assistant Vitals:   01/16/16 1408  BP: 116/74  Temp: 97.2 F (36.2 C)  TempSrc: Oral   General appearance:  Normal Abdomen soft with mild tenderness overlying her uterus which is 1-2 fingerbreadths below the umbilicus. No acute changes such as rebound or guarding. Bimanual with mild uterine tenderness to palpation but no acute changes.  Assessment/Plan:  [name] with large bulky myomas and pain. No acute changes to suggest infarction. Eating and having regular bowel movements without difficulty. Urinalysis appears contaminated. Will follow up on culture and treat if positive. We'll treat today with Toradol 60 mg IM. Ibuprofen 800 mg #30 with 1 refill to have available for ongoing pain relief. Keep scheduled multiple myomectomy in several weeks. Follow up sooner if pain becomes unacceptable.   Anastasio Auerbach MD, 2:27 PM 01/16/2016

## 2016-01-17 LAB — URINE CULTURE

## 2016-01-23 ENCOUNTER — Ambulatory Visit: Payer: BLUE CROSS/BLUE SHIELD | Admitting: Physical Therapy

## 2016-01-28 ENCOUNTER — Encounter: Payer: Self-pay | Admitting: Physical Therapy

## 2016-01-29 ENCOUNTER — Encounter (HOSPITAL_COMMUNITY): Payer: Self-pay

## 2016-01-29 ENCOUNTER — Encounter (HOSPITAL_COMMUNITY)
Admission: RE | Admit: 2016-01-29 | Discharge: 2016-01-29 | Disposition: A | Discharge status: Home / Self Care | Payer: BLUE CROSS/BLUE SHIELD | Source: Ambulatory Visit | Attending: Gynecology | Admitting: Gynecology

## 2016-01-29 DIAGNOSIS — Z01818 Encounter for other preprocedural examination: Secondary | ICD-10-CM | POA: Diagnosis not present

## 2016-01-29 HISTORY — DX: Headache: R51

## 2016-01-29 HISTORY — DX: Headache, unspecified: R51.9

## 2016-01-29 HISTORY — DX: Anemia, unspecified: D64.9

## 2016-01-29 HISTORY — DX: Gastro-esophageal reflux disease without esophagitis: K21.9

## 2016-01-29 HISTORY — DX: Scoliosis, unspecified: M41.9

## 2016-01-29 LAB — CBC
HEMATOCRIT: 35.6 % — AB (ref 36.0–46.0)
Hemoglobin: 11.8 g/dL — ABNORMAL LOW (ref 12.0–15.0)
MCH: 26.9 pg (ref 26.0–34.0)
MCHC: 33.1 g/dL (ref 30.0–36.0)
MCV: 81.1 fL (ref 78.0–100.0)
PLATELETS: 289 10*3/uL (ref 150–400)
RBC: 4.39 MIL/uL (ref 3.87–5.11)
RDW: 14.9 % (ref 11.5–15.5)
WBC: 7.6 10*3/uL (ref 4.0–10.5)

## 2016-01-29 LAB — TYPE AND SCREEN
ABO/RH(D): O NEG
Antibody Screen: NEGATIVE

## 2016-01-29 LAB — ABO/RH: ABO/RH(D): O NEG

## 2016-01-29 NOTE — Patient Instructions (Signed)
Your procedure is scheduled on:  Tuesday, February 11, 2016  Enter through the Main Entrance of [facility] at:  6:00 AM  Pick up the phone at the desk and dial [phone].  Call this number if you have problems the morning of surgery: [phone].  Remember: Do NOT eat food or drink after:  Midnight Monday, February 10, 2016  Take these medicines the morning of surgery with a SIP OF WATER:  None  Do NOT wear jewelry (body piercing), metal hair clips/bobby pins, make-up, or nail polish. Do NOT wear lotions, powders, or perfumes.  You may wear deodorant. Do NOT shave for 48 hours prior to surgery. Do NOT bring valuables to the hospital. Contacts, dentures, or bridgework may not be worn into surgery.  Leave suitcase in car.  After surgery it may be brought to your room.  For patients admitted to the hospital, checkout time is 11:00 AM the day of discharge.

## 2016-01-30 ENCOUNTER — Ambulatory Visit (INDEPENDENT_AMBULATORY_CARE_PROVIDER_SITE_OTHER): Payer: BLUE CROSS/BLUE SHIELD | Admitting: Gynecology

## 2016-01-30 ENCOUNTER — Encounter: Payer: Self-pay | Admitting: Gynecology

## 2016-01-30 VITALS — BP 118/76

## 2016-01-30 DIAGNOSIS — R102 Pelvic and perineal pain: Secondary | ICD-10-CM | POA: Diagnosis not present

## 2016-01-30 DIAGNOSIS — D251 Intramural leiomyoma of uterus: Secondary | ICD-10-CM | POA: Diagnosis not present

## 2016-01-30 NOTE — Patient Instructions (Signed)
Followup for surgery as scheduled. 

## 2016-01-30 NOTE — H&P (Signed)
  Catherine [name] 12/15/1985 [id number]   History and Physical  Chief complaint: Leiomyoma, pelvic pain  History of present illness: [name] with history of leiomyoma experiencing significant increasing  pelvic pain.  Initially placed on low-dose oral contraceptives for menstrual suppression but pain has progressed despite this. Options for management reviewed to include other hormonal manipulations such as Depo-Lupron or progesterone only, Lysteda for heavier bleeding, uterine artery embolization, myomectomy either open or robotic and hysterectomy. The pros/cons, risks/benefits of each approach were reviewed. The patient is virginal and prefers conservative management and elects for open myomectomy.  Past medical history,surgical history, medications, allergies, family history and social history were all reviewed and documented in the EPIC chart.  ROS:  Was performed and pertinent positives and negatives are included in the history of present illness.  Exam:  Caryn Bee assistant Vitals:   01/30/16 0858  BP: 118/76   General: well developed, well nourished female, no acute distress HEENT: normal  Lungs: clear to auscultation without wheezing, rales or rhonchi  Cardiac: regular rate without rubs, murmurs or gallops  Abdomen: soft, With mild tenderness on uterine palpation Pelvic: external bus vagina:  Normal to limited exam due to virginal status  Cervix:  Not visualized Uterus:  16 weeks to 18 week size uterus, tender to manipulation Adnexa:  Unable to evaluate secondary to uterine size    Assessment/Plan:  [name]  with multiple leiomyoma and worsening pain. Options for management were reviewed as above and patient elects for open myomectomy.  The expected intraoperative and postoperative courses as well as the recovery period were reviewed. The risks of infection, prolonged antibiotics, reoperation for abscess or hematoma formation was discussed. The risks of  hemorrhage necessitating transfusion and the risks of transfusion reaction, hepatitis, HIV, mad cow disease and other unknown entities was also discussed. Incisional complications to include opening and draining of incisions and closure by secondary intention, dehiscence and long-term issues of keloid/cosmetics and hernia formation were reviewed. The risk of inadvertent injury to internal organs including bowel, bladder, ureters, vessels, nerves either immediately recognized or delay recognized necessitating major exploratory reparative surgeries and future reparative surgeries including bowel resection, ostomy formation, bladder repair, ureteral damage repair was discussed with her. I reviewed with her specific risks with myomectomy to include significant risk of hemorrhage and transfusion. Multiple uterine incisions requiring cesarean section for subsequent deliveries. Risks of uterine rupture during pregnancy or labor with catastrophic outcome. Risks of uterine scarring either tubal blockage or cavity scarring leading to issues of infertility and the risks of catastrophic hemorrhage necessitating hysterectomy with absolute and irreversible sterility following was all discussed with her. She also understands that she is at significant risk of regrowth of leiomyoma in the future and then I will can only remove those leiomyoma that are large enough to be visualized and that she may have smaller ones that grow or she may develop new leiomyoma requiring subsequent treatments. The patient's questions were answered to her satisfaction and she is ready to proceed with surgery.     Anastasio Auerbach MD, 9:30 AM 01/30/2016

## 2016-01-30 NOTE — Progress Notes (Signed)
Catherine [name] 04-Jul-1985 [id number]   Preoperative counseling  Chief complaint: Leiomyoma, pelvic pain  History of present illness: [name] with history of leiomyoma experiencing significant increasing  pelvic pain.  Initially placed on low-dose oral contraceptives for menstrual suppression but pain has progressed despite this. Options for management reviewed to include other hormonal manipulations such as Depo-Lupron or progesterone only, Lysteda for heavier bleeding, uterine artery embolization, myomectomy either open or robotic and hysterectomy. The pros/cons, risks/benefits of each approach were reviewed. The patient is virginal and prefers conservative management and elects for open myomectomy.  Past medical history,surgical history, medications, allergies, family history and social history were all reviewed and documented in the EPIC chart.  ROS:  Was performed and pertinent positives and negatives are included in the history of present illness.  Exam:  Caryn Bee assistant Vitals:   01/30/16 0858  BP: 118/76   General: well developed, well nourished female, no acute distress HEENT: normal  Lungs: clear to auscultation without wheezing, rales or rhonchi  Cardiac: regular rate without rubs, murmurs or gallops  Abdomen: soft, With mild tenderness on uterine palpation Pelvic: external bus vagina:  Normal to limited exam due to virginal status  Cervix:  Not visualized Uterus:  16 weeks to 18 week size uterus, tender to manipulation Adnexa:  Unable to evaluate secondary to uterine size    Assessment/Plan:  [name]  with multiple leiomyoma and worsening pain. Options for management were reviewed as above and patient elects for open myomectomy.  The expected intraoperative and postoperative courses as well as the recovery period were reviewed. The risks of infection, prolonged antibiotics, reoperation for abscess or hematoma formation was discussed. The risks of  hemorrhage necessitating transfusion and the risks of transfusion reaction, hepatitis, HIV, mad cow disease and other unknown entities was also discussed. Incisional complications to include opening and draining of incisions and closure by secondary intention, dehiscence and long-term issues of keloid/cosmetics and hernia formation were reviewed. The risk of inadvertent injury to internal organs including bowel, bladder, ureters, vessels, nerves either immediately recognized or delay recognized necessitating major exploratory reparative surgeries and future reparative surgeries including bowel resection, ostomy formation, bladder repair, ureteral damage repair was discussed with her. I reviewed with her specific risks with myomectomy to include significant risk of hemorrhage and transfusion. Multiple uterine incisions requiring cesarean section for subsequent deliveries. Risks of uterine rupture during pregnancy or labor with catastrophic outcome. Risks of uterine scarring either tubal blockage or cavity scarring leading to issues of infertility and the risks of catastrophic hemorrhage necessitating hysterectomy with absolute and irreversible sterility following was all discussed with her. She also understands that she is at significant risk of regrowth of leiomyoma in the future and then I will can only remove those leiomyoma that are large enough to be visualized and that she may have smaller ones that grow or she may develop new leiomyoma requiring subsequent treatments. The patient's questions were answered to her satisfaction and she is ready to proceed with surgery.     Anastasio Auerbach MD, 9:18 AM 01/30/2016

## 2016-02-10 MED ORDER — DEXTROSE 5 % IV SOLN
2.0000 g | INTRAVENOUS | Status: AC
  Administered 2016-02-11: 2 g via INTRAVENOUS
  Filled 2016-02-10: qty 2

## 2016-02-11 ENCOUNTER — Inpatient Hospital Stay (HOSPITAL_COMMUNITY): Payer: BLUE CROSS/BLUE SHIELD | Admitting: Anesthesiology

## 2016-02-11 ENCOUNTER — Ambulatory Visit (HOSPITAL_COMMUNITY)
Admission: RE | Admit: 2016-02-11 | Discharge: 2016-02-13 | Disposition: A | Discharge status: Home / Self Care | Payer: BLUE CROSS/BLUE SHIELD | Source: Ambulatory Visit | Attending: Gynecology | Admitting: Gynecology

## 2016-02-11 ENCOUNTER — Encounter (HOSPITAL_COMMUNITY): Payer: Self-pay | Admitting: *Deleted

## 2016-02-11 ENCOUNTER — Encounter (HOSPITAL_COMMUNITY)
Admission: RE | Disposition: A | Discharge status: Home / Self Care | Payer: Self-pay | Source: Ambulatory Visit | Attending: Gynecology

## 2016-02-11 DIAGNOSIS — K219 Gastro-esophageal reflux disease without esophagitis: Secondary | ICD-10-CM | POA: Insufficient documentation

## 2016-02-11 DIAGNOSIS — D219 Benign neoplasm of connective and other soft tissue, unspecified: Secondary | ICD-10-CM | POA: Diagnosis present

## 2016-02-11 DIAGNOSIS — D649 Anemia, unspecified: Secondary | ICD-10-CM | POA: Diagnosis not present

## 2016-02-11 DIAGNOSIS — N920 Excessive and frequent menstruation with regular cycle: Secondary | ICD-10-CM | POA: Diagnosis not present

## 2016-02-11 DIAGNOSIS — D250 Submucous leiomyoma of uterus: Secondary | ICD-10-CM | POA: Insufficient documentation

## 2016-02-11 DIAGNOSIS — D259 Leiomyoma of uterus, unspecified: Secondary | ICD-10-CM | POA: Diagnosis not present

## 2016-02-11 DIAGNOSIS — R102 Pelvic and perineal pain: Secondary | ICD-10-CM | POA: Insufficient documentation

## 2016-02-11 DIAGNOSIS — D251 Intramural leiomyoma of uterus: Secondary | ICD-10-CM | POA: Diagnosis not present

## 2016-02-11 DIAGNOSIS — D252 Subserosal leiomyoma of uterus: Secondary | ICD-10-CM | POA: Diagnosis not present

## 2016-02-11 HISTORY — PX: MYOMECTOMY: SHX85

## 2016-02-11 HISTORY — PX: LAPAROTOMY: SHX154

## 2016-02-11 LAB — TYPE AND SCREEN
ABO/RH(D): O NEG
Antibody Screen: NEGATIVE

## 2016-02-11 LAB — HCG, SERUM, QUALITATIVE: PREG SERUM: NEGATIVE

## 2016-02-11 SURGERY — LAPAROTOMY
Anesthesia: General

## 2016-02-11 MED ORDER — KETOROLAC TROMETHAMINE 30 MG/ML IJ SOLN
INTRAMUSCULAR | Status: AC
  Filled 2016-02-11: qty 1

## 2016-02-11 MED ORDER — DEXAMETHASONE SODIUM PHOSPHATE 10 MG/ML IJ SOLN
INTRAMUSCULAR | Status: DC | PRN
Start: 2016-02-11 — End: 2016-02-11
  Administered 2016-02-11: 4 mg via INTRAVENOUS

## 2016-02-11 MED ORDER — SODIUM CHLORIDE 0.9 % IJ SOLN
INTRAMUSCULAR | Status: AC
  Filled 2016-02-11: qty 30

## 2016-02-11 MED ORDER — KETOROLAC TROMETHAMINE 30 MG/ML IJ SOLN
30.0000 mg | Freq: Four times a day (QID) | INTRAMUSCULAR | Status: DC
  Administered 2016-02-11 – 2016-02-12 (×3): 30 mg via INTRAVENOUS
  Filled 2016-02-11 (×3): qty 1

## 2016-02-11 MED ORDER — KETOROLAC TROMETHAMINE 30 MG/ML IJ SOLN
INTRAMUSCULAR | Status: DC | PRN
  Administered 2016-02-11: 30 mg via INTRAVENOUS

## 2016-02-11 MED ORDER — MIDAZOLAM HCL 2 MG/2ML IJ SOLN
INTRAMUSCULAR | Status: AC
  Filled 2016-02-11: qty 2

## 2016-02-11 MED ORDER — FENTANYL CITRATE (PF) 100 MCG/2ML IJ SOLN
25.0000 ug | INTRAMUSCULAR | Status: DC | PRN
  Administered 2016-02-11 (×4): 50 ug via INTRAVENOUS

## 2016-02-11 MED ORDER — SUGAMMADEX SODIUM 200 MG/2ML IV SOLN
INTRAVENOUS | Status: DC | PRN
  Administered 2016-02-11: 170.8 mg via INTRAVENOUS

## 2016-02-11 MED ORDER — SODIUM CHLORIDE 0.9% FLUSH: 9.0000 mL | INTRAVENOUS | Status: DC | PRN

## 2016-02-11 MED ORDER — FENTANYL CITRATE (PF) 100 MCG/2ML IJ SOLN
INTRAMUSCULAR | Status: AC
  Filled 2016-02-11: qty 2

## 2016-02-11 MED ORDER — VASOPRESSIN 20 UNIT/ML IV SOLN
INTRAVENOUS | Status: AC
  Filled 2016-02-11: qty 1

## 2016-02-11 MED ORDER — DIPHENHYDRAMINE HCL 50 MG/ML IJ SOLN: 12.5000 mg | Freq: Four times a day (QID) | INTRAMUSCULAR | Status: DC | PRN

## 2016-02-11 MED ORDER — LACTATED RINGERS IV SOLN
INTRAVENOUS | Status: DC
  Administered 2016-02-11 (×2): via INTRAVENOUS

## 2016-02-11 MED ORDER — MIDAZOLAM HCL 2 MG/2ML IJ SOLN
INTRAMUSCULAR | Status: DC | PRN
  Administered 2016-02-11: 1 mg via INTRAVENOUS

## 2016-02-11 MED ORDER — SCOPOLAMINE 1 MG/3DAYS TD PT72
1.0000 | MEDICATED_PATCH | Freq: Once | TRANSDERMAL | Status: DC
  Administered 2016-02-11: 1.5 mg via TRANSDERMAL

## 2016-02-11 MED ORDER — ROCURONIUM BROMIDE 100 MG/10ML IV SOLN
INTRAVENOUS | Status: AC
  Filled 2016-02-11: qty 1

## 2016-02-11 MED ORDER — PROMETHAZINE HCL 25 MG/ML IJ SOLN: 6.2500 mg | INTRAMUSCULAR | Status: DC | PRN

## 2016-02-11 MED ORDER — ONDANSETRON HCL 4 MG/2ML IJ SOLN: 4.0000 mg | Freq: Four times a day (QID) | INTRAMUSCULAR | Status: DC | PRN

## 2016-02-11 MED ORDER — VASOPRESSIN 20 UNIT/ML IV SOLN
INTRAVENOUS | Status: DC | PRN
  Administered 2016-02-11: 36 mL via INTRAMUSCULAR
  Administered 2016-02-11: 10 mL via INTRAMUSCULAR
  Administered 2016-02-11: 4 mL via INTRAMUSCULAR

## 2016-02-11 MED ORDER — BUPIVACAINE LIPOSOME 1.3 % IJ SUSP
20.0000 mL | Freq: Once | INTRAMUSCULAR | Status: AC
  Administered 2016-02-11: 40 mL
  Filled 2016-02-11: qty 20

## 2016-02-11 MED ORDER — ONDANSETRON HCL 4 MG/2ML IJ SOLN
INTRAMUSCULAR | Status: AC
  Filled 2016-02-11: qty 2

## 2016-02-11 MED ORDER — LIDOCAINE HCL (CARDIAC) 20 MG/ML IV SOLN
INTRAVENOUS | Status: DC | PRN
  Administered 2016-02-11: 70 mg via INTRAVENOUS
  Administered 2016-02-11: 50 mg via INTRAVENOUS
  Administered 2016-02-11: 30 mg via INTRAVENOUS

## 2016-02-11 MED ORDER — KETOROLAC TROMETHAMINE 30 MG/ML IJ SOLN: 30.0000 mg | Freq: Four times a day (QID) | INTRAMUSCULAR | Status: DC

## 2016-02-11 MED ORDER — ROCURONIUM BROMIDE 100 MG/10ML IV SOLN
INTRAVENOUS | Status: DC | PRN
  Administered 2016-02-11: 35 mg via INTRAVENOUS
  Administered 2016-02-11 (×3): 5 mg via INTRAVENOUS

## 2016-02-11 MED ORDER — PROPOFOL 10 MG/ML IV BOLUS
INTRAVENOUS | Status: DC | PRN
  Administered 2016-02-11: 180 mg via INTRAVENOUS

## 2016-02-11 MED ORDER — NALOXONE HCL 0.4 MG/ML IJ SOLN: 0.4000 mg | INTRAMUSCULAR | Status: DC | PRN

## 2016-02-11 MED ORDER — DEXAMETHASONE SODIUM PHOSPHATE 4 MG/ML IJ SOLN
INTRAMUSCULAR | Status: AC
  Filled 2016-02-11: qty 1

## 2016-02-11 MED ORDER — MORPHINE SULFATE 2 MG/ML IV SOLN
INTRAVENOUS | Status: DC
  Administered 2016-02-11: 0.9 mL via INTRAVENOUS
  Administered 2016-02-11: 1.5 mg via INTRAVENOUS
  Administered 2016-02-11: 12:00:00 via INTRAVENOUS
  Administered 2016-02-11: 3.5 mL via INTRAVENOUS
  Administered 2016-02-12 (×2): 0.3 mg via INTRAVENOUS
  Filled 2016-02-11: qty 25

## 2016-02-11 MED ORDER — DIPHENHYDRAMINE HCL 12.5 MG/5ML PO ELIX: 12.5000 mg | ORAL_SOLUTION | Freq: Four times a day (QID) | ORAL | Status: DC | PRN

## 2016-02-11 MED ORDER — SCOPOLAMINE 1 MG/3DAYS TD PT72
MEDICATED_PATCH | TRANSDERMAL | Status: AC
  Filled 2016-02-11: qty 1

## 2016-02-11 MED ORDER — FENTANYL CITRATE (PF) 250 MCG/5ML IJ SOLN
INTRAMUSCULAR | Status: AC
  Filled 2016-02-11: qty 5

## 2016-02-11 MED ORDER — ONDANSETRON HCL 4 MG/2ML IJ SOLN
INTRAMUSCULAR | Status: DC | PRN
  Administered 2016-02-11: 4 mg via INTRAVENOUS

## 2016-02-11 MED ORDER — SODIUM CHLORIDE 0.9 % IJ SOLN
INTRAMUSCULAR | Status: AC
  Filled 2016-02-11: qty 50

## 2016-02-11 MED ORDER — LIDOCAINE HCL (CARDIAC) 20 MG/ML IV SOLN
INTRAVENOUS | Status: AC
  Filled 2016-02-11: qty 5

## 2016-02-11 MED ORDER — PROPOFOL 10 MG/ML IV BOLUS
INTRAVENOUS | Status: AC
  Filled 2016-02-11: qty 20

## 2016-02-11 MED ORDER — FENTANYL CITRATE (PF) 100 MCG/2ML IJ SOLN
INTRAMUSCULAR | Status: DC | PRN
  Administered 2016-02-11 (×6): 50 ug via INTRAVENOUS

## 2016-02-11 MED ORDER — DEXTROSE-NACL 5-0.9 % IV SOLN
INTRAVENOUS | Status: DC
  Administered 2016-02-11 – 2016-02-12 (×3): via INTRAVENOUS

## 2016-02-11 SURGICAL SUPPLY — 66 items
BARRIER ADHS 3X4 INTERCEED (GAUZE/BANDAGES/DRESSINGS) IMPLANT
BLADE SURG 10 STRL SS (BLADE) ×2 IMPLANT
BLADE SURG 15 STRL LF C SS BP (BLADE) ×1 IMPLANT
BLADE SURG 15 STRL SS (BLADE) ×1
CANISTER SUCT 3000ML (MISCELLANEOUS) ×2 IMPLANT
CATH FOLEY 2WAY  3CC  8FR (CATHETERS)
CATH FOLEY 2WAY 3CC 8FR (CATHETERS) IMPLANT
CELLS DAT CNTRL 66122 CELL SVR (MISCELLANEOUS) IMPLANT
CLOTH BEACON ORANGE TIMEOUT ST (SAFETY) ×2 IMPLANT
CONT PATH 16OZ SNAP LID 3702 (MISCELLANEOUS) ×2 IMPLANT
DECANTER SPIKE VIAL GLASS SM (MISCELLANEOUS) ×2 IMPLANT
DISSECTOR SPONGE CHERRY (GAUZE/BANDAGES/DRESSINGS) IMPLANT
DRAPE CESAREAN BIRTH W POUCH (DRAPES) ×2 IMPLANT
DRAPE WARM FLUID 44X44 (DRAPE) ×2 IMPLANT
DURAPREP 26ML APPLICATOR (WOUND CARE) ×2 IMPLANT
EXTRACTOR VACUUM KIWI (MISCELLANEOUS) ×2 IMPLANT
FILTER STRAW FLUID ASPIR (MISCELLANEOUS) IMPLANT
GAUZE SPONGE 4X4 16PLY XRAY LF (GAUZE/BANDAGES/DRESSINGS) ×2 IMPLANT
GLOVE BIO SURGEON STRL SZ7.5 (GLOVE) ×2 IMPLANT
GLOVE BIOGEL PI IND STRL 7.0 (GLOVE) ×2 IMPLANT
GLOVE BIOGEL PI IND STRL 8 (GLOVE) ×1 IMPLANT
GLOVE BIOGEL PI INDICATOR 7.0 (GLOVE) ×2
GLOVE BIOGEL PI INDICATOR 8 (GLOVE) ×1
GLOVE ECLIPSE 7.5 STRL STRAW (GLOVE) ×2 IMPLANT
GOWN STRL REUS W/TWL LRG LVL3 (GOWN DISPOSABLE) ×6 IMPLANT
HYDROGEN PEROXIDE 16OZ (MISCELLANEOUS) IMPLANT
IV STOPCOCK 4 WAY 40  W/Y SET (IV SOLUTION)
IV STOPCOCK 4 WAY 40 W/Y SET (IV SOLUTION) IMPLANT
NEEDLE HYPO 18GX1.5 BLUNT FILL (NEEDLE) ×2 IMPLANT
NEEDLE HYPO 22GX1.5 SAFETY (NEEDLE) ×4 IMPLANT
NS IRRIG 1000ML POUR BTL (IV SOLUTION) ×2 IMPLANT
PACK ABDOMINAL GYN (CUSTOM PROCEDURE TRAY) ×2 IMPLANT
PAD OB MATERNITY 4.3X12.25 (PERSONAL CARE ITEMS) ×2 IMPLANT
PROTECTOR NERVE ULNAR (MISCELLANEOUS) ×4 IMPLANT
RETRACTOR WND ALEXIS 25 LRG (MISCELLANEOUS) IMPLANT
RTRCTR WOUND ALEXIS 18CM MED (MISCELLANEOUS)
RTRCTR WOUND ALEXIS 25CM LRG (MISCELLANEOUS)
SPONGE GAUZE 4X4 12PLY STER LF (GAUZE/BANDAGES/DRESSINGS) IMPLANT
SPONGE LAP 18X18 X RAY DECT (DISPOSABLE) ×4 IMPLANT
SPONGE LAP 4X18 X RAY DECT (DISPOSABLE) ×2 IMPLANT
STAPLER VISISTAT 35W (STAPLE) IMPLANT
SUT CHROMIC 3 0 TIES (SUTURE) IMPLANT
SUT PDS AB 0 CT1 27 (SUTURE) IMPLANT
SUT PDS AB 2-0 CT1 27 (SUTURE) IMPLANT
SUT PDS AB 3-0 SH 27 (SUTURE) ×10 IMPLANT
SUT SILK 3 0 SH 30 (SUTURE) IMPLANT
SUT VIC AB 0 CT1 18XCR BRD8 (SUTURE) ×3 IMPLANT
SUT VIC AB 0 CT1 27 (SUTURE) ×3
SUT VIC AB 0 CT1 27XBRD ANBCTR (SUTURE) ×3 IMPLANT
SUT VIC AB 0 CT1 8-18 (SUTURE) ×3
SUT VIC AB 2-0 CT1 27 (SUTURE) ×2
SUT VIC AB 2-0 CT1 TAPERPNT 27 (SUTURE) ×2 IMPLANT
SUT VIC AB 3-0 PS1 18 (SUTURE)
SUT VIC AB 3-0 PS1 18X BRD (SUTURE) IMPLANT
SUT VIC AB 4-0 KS 27 (SUTURE) ×2 IMPLANT
SUT VICRYL 0 TIES 12 18 (SUTURE) IMPLANT
SUT VICRYL 3 0 BR 18  UND (SUTURE) ×1
SUT VICRYL 3 0 BR 18 UND (SUTURE) ×1 IMPLANT
SYR 20CC LL (SYRINGE) IMPLANT
SYR 3ML 22GX1 1/2 SAFETY (SYRINGE) IMPLANT
SYR 50ML LL SCALE MARK (SYRINGE) IMPLANT
SYR 5ML LL (SYRINGE) ×2 IMPLANT
SYR CONTROL 10ML LL (SYRINGE) ×4 IMPLANT
TOWEL OR 17X24 6PK STRL BLUE (TOWEL DISPOSABLE) ×4 IMPLANT
TRAY FOLEY CATH SILVER 14FR (SET/KITS/TRAYS/PACK) ×2 IMPLANT
WATER STERILE IRR 1000ML POUR (IV SOLUTION) ×2 IMPLANT

## 2016-02-11 NOTE — H&P (Signed)
  The patient was examined.  I reviewed the proposed surgery and consent form with the patient.  The dictated history and physical is current and accurate and all questions were answered. The patient is ready to proceed with surgery and has a realistic understanding and expectation for the outcome.   Anastasio Auerbach MD, 6:58 AM 02/11/2016

## 2016-02-11 NOTE — Transfer of Care (Signed)
Immediate Anesthesia Transfer of Care Note  Patient: [name]  Procedure(s) Performed: Procedure(s): EXPLORATORY LAPAROTOMY (N/A) MYOMECTOMY (N/A)  Patient Location: PACU  Anesthesia Type:General  Level of Consciousness: awake, sedated and patient cooperative  Airway & Oxygen Therapy: Patient Spontanous Breathing and Patient connected to nasal cannula oxygen  Post-op Assessment: Report given to RN and Post -op Vital signs reviewed and stable  Post vital signs: Reviewed and stable  Last Vitals:  Vitals:   02/11/16 0612  BP: 125/89  Pulse: 92  Resp: 16  Temp: 36.8 C    Last Pain:  Vitals:   02/11/16 0612  TempSrc: Oral      Patients Stated Pain Goal: 2 ([id number])  Complications: No apparent anesthesia complications

## 2016-02-11 NOTE — Anesthesia Postprocedure Evaluation (Addendum)
Anesthesia Post Note  Patient: Emmalynn E Strike  Procedure(s) Performed: Procedure(s) (LRB): EXPLORATORY LAPAROTOMY (N/A) MYOMECTOMY (N/A)  Patient location during evaluation: [facility] Anesthesia Type: General Level of consciousness: awake, awake and alert, oriented and patient cooperative Pain management: pain level controlled Vital Signs Assessment: post-procedure vital signs reviewed and stable Respiratory status: spontaneous breathing, nonlabored ventilation, respiratory function stable and patient connected to nasal cannula oxygen Cardiovascular status: stable Postop Assessment: no signs of nausea or vomiting Anesthetic complications: no     Last Vitals:  Vitals:   02/11/16 1141 02/11/16 1243  BP: 122/81 125/76  Pulse: 88 88  Resp: 20 19  Temp: 37.1 C 36.8 C    Last Pain:  Vitals:   02/11/16 1258  TempSrc:   PainSc: 5    Pain Goal: Patients Stated Pain Goal: 2 (02/11/16 1258)               Latese Dufault L

## 2016-02-11 NOTE — Addendum Note (Signed)
Addendum  created 02/11/16 1355 by Raenette Rover, CRNA   Sign clinical note

## 2016-02-11 NOTE — Anesthesia Postprocedure Evaluation (Signed)
Anesthesia Post Note  Patient: Catherine [name]  Procedure(s) Performed: Procedure(s) (LRB): EXPLORATORY LAPAROTOMY (N/A) MYOMECTOMY (N/A)  Patient location during evaluation: PACU Anesthesia Type: General Level of consciousness: awake and alert Pain management: pain level controlled Vital Signs Assessment: post-procedure vital signs reviewed and stable Respiratory status: spontaneous breathing, nonlabored ventilation, respiratory function stable and patient connected to nasal cannula oxygen Cardiovascular status: blood pressure returned to baseline and stable Postop Assessment: no signs of nausea or vomiting Anesthetic complications: no     Last Vitals:  Vitals:   02/11/16 0942 02/11/16 0945  BP: (!) 141/97 (!) 144/95  Pulse: (!) 101 96  Resp: 16 16  Temp: 37.1 C     Last Pain:  Vitals:   02/11/16 1000  TempSrc:   PainSc: 10-Worst pain ever   Pain Goal: Patients Stated Pain Goal: 2 (02/11/16 0612)               Reginal Lutes

## 2016-02-11 NOTE — Anesthesia Procedure Notes (Signed)
Procedure Name: Intubation Date/Time: 02/11/2016 7:29 AM Performed by: Tobin Chad Pre-anesthesia Checklist: Patient identified, Timeout performed, Emergency Drugs available, Suction available and Patient being monitored Patient Re-evaluated:Patient Re-evaluated prior to inductionOxygen Delivery Method: Circle system utilized and Simple face mask Preoxygenation: Pre-oxygenation with 100% oxygen Intubation Type: IV induction Ventilation: Mask ventilation without difficulty Laryngoscope Size: Mac and 3 Grade View: Grade II Tube type: Oral Tube size: 7.0 mm Number of attempts: 1 Placement Confirmation: ETT inserted through vocal cords under direct vision,  positive ETCO2 and breath sounds checked- equal and bilateral Secured at: 22 cm Tube secured with: Tape (22) Dental Injury: Teeth and Oropharynx as per pre-operative assessment

## 2016-02-11 NOTE — Anesthesia Preprocedure Evaluation (Addendum)
Anesthesia Evaluation  Patient identified by MRN, date of birth, ID band Patient awake    Reviewed: Allergy & Precautions, NPO status , Patient's Chart, lab work & pertinent test results  Airway Mallampati: III  TM Distance: >3 FB Neck ROM: Full    Dental no notable dental hx.    Pulmonary    Pulmonary exam normal        Cardiovascular negative cardio ROS Normal cardiovascular exam     Neuro/Psych  Headaches,    GI/Hepatic GERD  Medicated and Controlled,  Endo/Other    Renal/GU      Musculoskeletal   Abdominal   Peds  Hematology   Anesthesia Other Findings   Reproductive/Obstetrics                            Anesthesia Physical Anesthesia Plan  ASA: II  Anesthesia Plan: General   Post-op Pain Management:    Induction: Intravenous  Airway Management Planned: Oral ETT  Additional Equipment:   Intra-op Plan:   Post-operative Plan: Extubation in OR  Informed Consent: I have reviewed the patients History and Physical, chart, labs and discussed the procedure including the risks, benefits and alternatives for the proposed anesthesia with the patient or authorized representative who has indicated his/her understanding and acceptance.   Dental advisory given  Plan Discussed with: Surgeon and CRNA  Anesthesia Plan Comments:         Anesthesia Quick Evaluation

## 2016-02-11 NOTE — Op Note (Signed)
Savi Voice Strausbaugh 07-14-1985 [id number]   Post Operative Note   Date of surgery:  02/11/2016  Pre Op Dx:  Pelvic pain female, leiomyoma  Post Op Dx:  Pelvic pain female, leiomyoma  Procedure:  Exploratory laparotomy, multiple myomectomy  Surgeon:  Anastasio Auerbach  Assistant:  Uvaldo Rising  Anesthesia:  General  EBL:  250 cc anesthesia reported  Complications:  None  Specimen:  Multiple leiomyoma numbering 8 with total weight 659 g to pathology  Findings: EUA:  Abdomen with abdominopelvic mass to to figure breast below the umbilicus.   Operative:  Enlarged uterus with multiple leiomyoma ranging from subserosal, intramural and submucosal. All palpable leiomyoma removed. Right and left ovaries grossly normal. Right and left fallopian tubes grossly normal length, caliber and fimbriated ends. Pelvis without evidence of adhesions or endometriosis. Upper abdominal palpable exam normal.  Procedure:  The patient was taken to the operating room, properly identified, placed in the supine position and underwent general anesthesia without difficulty. The patient then received an abdominal preparation with DuraPrep, a vaginal/perineal preparation with Betadine solution and a Foley catheter was placed in sterile technique per nursing personnel. The timeout was then performed by the surgical team and the patient was subsequently draped in usual fashion. The abdomen was entered through a Pfannenstiel incision achieving adequate hemostasis at all levels. A small amount of medial rectus muscle splitting was required bilaterally to deliver the uterus through the incision using a vacuum extractor to assist. A Balfour retractor and bladder blade was then placed within the incision and the intestines were packed from the operative field. A large anterior fundal myoma was identified and the subserosal tissue planes were injected using vasopressin in a 20 unit per 50 cc saline dilution and subsequently the  serosa was incised and the myoma was removed through sharp and blunt dissection. All remaining myomas were subsequently removed, initially attempting to use the larger myoma incision but also requiring several other serosal incisions to remove all of the myomas. Subserosal injection of the vasopressin mixture was used with each excision process. All dissection planes and incisions were away from the tubal insertion sites and it did not appear that the endometrial cavity was entered..  Due to the virginal status of the patient I was unable to preoperatively place a catheter for chromopertubation. All incision sites were then closed in progressive layers reapproximating the myometrium using 0 Vicryl suture and the serosa was then reapproximated using 3-0 PDS suture in a running stitch. Adequate hemostasis was visualized at all incision sites and the bowel packing was removed, the uterus returned to the abdomen and the Balfour retractor and bladder blade were subsequently removed. The pelvis was copious irrigated noting continued hemostasis at the incision sites. The anterior fascia was reapproximated using 0 Vicryl suture in a running stitch starting at the angle and meeting in the middle. Exparel was then injected into the subcutaneous/peri-fascial tissues and the subcutaneous tissues were irrigated and hemostasis achieved using electrocautery. The skin was re-approximated using 4-0 Vicryl in a running subcuticular stitch, benzoin with Steri-Strips applied, a sterile dressing applied and a subsequent pressure dressing applied. The patient received intraoperative Toradol, was awakened without difficulty and was taken recovery room in good condition having tolerated procedure well.    Anastasio Auerbach MD, 9:47 AM 02/11/2016

## 2016-02-11 NOTE — Progress Notes (Signed)
Patient ID: Catherine [name], female   DOB: 1986-02-19, 30 y.o.   MRN: [id number] LEXANY PELLMAN January 17, 1986 [id number]   Day of Surgery s/p Procedure(s): EXPLORATORY LAPAROTOMY MYOMECTOMY  Subjective: Patient reports feels well, no acute distress, pain severity reported moderate, Yes.   taking PO, foley catheter in place, Yes.   ambulating, No. passing flatus  Objective: Vital signs in last 24 hours: Temp:  [98.3 F (36.8 C)-99 F (37.2 C)] 98.6 F (37 C) (08/29 1607) Pulse Rate:  [68-101] 81 (08/29 1607) Resp:  [14-22] 22 (08/29 1607) BP: (119-144)/(76-97) 131/83 (08/29 1607) SpO2:  [90 %-100 %] 100 % (08/29 1648) Weight:  [188 lb (85.3 kg)] 188 lb (85.3 kg) (08/29 1438) Last BM Date: 02/11/16  EXAM General: awake, alert and no distress Resp: clear to auscultation bilaterally Cardio: regular rate and rhythm GI: soft, mild tenderness, bowel sounds active, dressing dry Lower Extremities: Without swelling or tenderness Vaginal Bleeding: reported scant   Assessment: s/p Procedure(s): EXPLORATORY LAPAROTOMY MYOMECTOMY: stable and progressing well  Plan: Results of surgery reviewed with the patient Continue routine post operative care Anticipate discharge later tomorrow   LOS: 1 day    Anastasio Auerbach MD, 5:16 PM 02/11/2016

## 2016-02-12 ENCOUNTER — Encounter (HOSPITAL_COMMUNITY): Payer: Self-pay | Admitting: Gynecology

## 2016-02-12 DIAGNOSIS — D252 Subserosal leiomyoma of uterus: Secondary | ICD-10-CM | POA: Diagnosis not present

## 2016-02-12 DIAGNOSIS — R102 Pelvic and perineal pain: Secondary | ICD-10-CM | POA: Diagnosis not present

## 2016-02-12 DIAGNOSIS — D250 Submucous leiomyoma of uterus: Secondary | ICD-10-CM | POA: Diagnosis not present

## 2016-02-12 DIAGNOSIS — D251 Intramural leiomyoma of uterus: Secondary | ICD-10-CM | POA: Diagnosis not present

## 2016-02-12 LAB — CBC
HEMATOCRIT: 30 % — AB (ref 36.0–46.0)
HEMOGLOBIN: 9.6 g/dL — AB (ref 12.0–15.0)
MCH: 26 pg (ref 26.0–34.0)
MCHC: 32 g/dL (ref 30.0–36.0)
MCV: 81.3 fL (ref 78.0–100.0)
Platelets: 238 10*3/uL (ref 150–400)
RBC: 3.69 MIL/uL — AB (ref 3.87–5.11)
RDW: 14.8 % (ref 11.5–15.5)
WBC: 8.4 10*3/uL (ref 4.0–10.5)

## 2016-02-12 MED ORDER — IBUPROFEN 800 MG PO TABS
800.0000 mg | ORAL_TABLET | Freq: Three times a day (TID) | ORAL | Status: DC | PRN
  Administered 2016-02-12 – 2016-02-13 (×2): 800 mg via ORAL
  Filled 2016-02-12 (×2): qty 1

## 2016-02-12 MED ORDER — DIPHENHYDRAMINE HCL 25 MG PO CAPS: 50.0000 mg | ORAL_CAPSULE | Freq: Four times a day (QID) | ORAL | Status: DC | PRN

## 2016-02-12 MED ORDER — ONDANSETRON HCL 4 MG PO TABS: 4.0000 mg | ORAL_TABLET | Freq: Once | ORAL | Status: DC

## 2016-02-12 MED ORDER — OXYCODONE-ACETAMINOPHEN 5-325 MG PO TABS
2.0000 | ORAL_TABLET | Freq: Four times a day (QID) | ORAL | Status: DC | PRN
  Administered 2016-02-12 – 2016-02-13 (×5): 2 via ORAL
  Filled 2016-02-12 (×6): qty 2

## 2016-02-12 NOTE — Progress Notes (Signed)
Patient ID: Catherine [name], female   DOB: 03/20/1986, 30 y.o.   MRN: [id number] Catherine [name] 1985/11/30 [id number]   1 Day Post-Op s/p Procedure(s): EXPLORATORY LAPAROTOMY MYOMECTOMY  Subjective: Patient reports no complaints, no acute distress, pain severity reported mild, Yes.   taking PO, foley catheter out, No. voiding, Yes.   ambulating, No. passing flatus  Objective: Vital signs in last 24 hours: Temp:  [98.3 F (36.8 C)-99 F (37.2 C)] 99 F (37.2 C) (08/30 0609) Pulse Rate:  [60-101] 66 (08/30 0609) Resp:  [14-23] 18 (08/30 0609) BP: (92-144)/(59-97) 92/63 (08/30 0609) SpO2:  [90 %-100 %] 99 % (08/30 0609) Weight:  [188 lb (85.3 kg)] 188 lb (85.3 kg) (08/29 1438) Last BM Date: 02/11/16  EXAM General: awake, no distress Resp: clear to auscultation bilaterally Cardio: regular rate and rhythm GI: soft, mild tenderness, bowel sounds active, dressings dry.  Pressure dressing removed Lower Extremities: Without swelling or tenderness Vaginal Bleeding: scant  Lab Results:   Recent Labs  02/12/16 0555  WBC 8.4  HGB 9.6*  HCT 30.0*  PLT 238    Assessment: s/p Procedure(s): EXPLORATORY LAPAROTOMY MYOMECTOMY: stable and progressing well  Plan: Continue routine post operative care  Advance diet Encourage ambulation Advance to PO medication Discontinue IV fluids  Consider discharge later today  LOS: 1 day    Anastasio Auerbach MD, 7:34 AM 02/12/2016

## 2016-02-12 NOTE — Progress Notes (Signed)
Patient ID: Catherine [name], female   DOB: Nov 06, 1985, 30 y.o.   MRN: [id number] In to see patient.  Foley out, voided without difficulty.  Ambulated. Taking PO.  Using oral pain meds  Abd soft, active BS with minimal pain.  Dressing dry intact  Offered discharge today.  Patient feels she wants to wait until tomorrow.  Reviewed discharge precautions, post op wound care and follow up appts with her.  Will plan discharge tomorrow AM assuming she continues well.

## 2016-02-13 DIAGNOSIS — D250 Submucous leiomyoma of uterus: Secondary | ICD-10-CM | POA: Diagnosis not present

## 2016-02-13 DIAGNOSIS — R102 Pelvic and perineal pain: Secondary | ICD-10-CM | POA: Diagnosis not present

## 2016-02-13 DIAGNOSIS — D251 Intramural leiomyoma of uterus: Secondary | ICD-10-CM | POA: Diagnosis not present

## 2016-02-13 DIAGNOSIS — D252 Subserosal leiomyoma of uterus: Secondary | ICD-10-CM | POA: Diagnosis not present

## 2016-02-13 MED ORDER — IBUPROFEN 800 MG PO TABS: 800.0000 mg | ORAL_TABLET | Freq: Three times a day (TID) | ORAL | 0 refills | Status: DC | PRN

## 2016-02-13 MED ORDER — OXYCODONE-ACETAMINOPHEN 5-325 MG PO TABS: 1.0000 | ORAL_TABLET | ORAL | 0 refills | Status: DC | PRN

## 2016-02-13 MED ORDER — ONDANSETRON HCL 4 MG PO TABS
4.0000 mg | ORAL_TABLET | Freq: Once | ORAL | 0 refills | Status: AC
Start: 2016-02-13 — End: 2016-02-13

## 2016-02-13 NOTE — Progress Notes (Signed)
Pt ambulated out teaching complete removal of dressing discussed

## 2016-02-13 NOTE — Discharge Instructions (Signed)
Postoperative Instructions Myomectomy  Dr. Phineas Real and the nursing staff have discussed postoperative instructions with you.  If you have any questions please ask them before you leave the hospital, or call Dr Elisabeth Most office at [phone].    We would like to emphasize the following instructions:   ? Call the office to make your follow-up appointment as recommended by Dr Phineas Real (usually 2 weeks).  ? You were given a prescription, or one was ordered for you at the pharmacy you designated.  Get that prescription filled and take the medication according to instructions.  ? You may eat a regular diet, but slowly until you start having bowel movements.  ? Drink plenty of water daily.  ? Nothing in the vagina (intercourse, douching, objects of any kind) until released by Dr Phineas Real.  ? No driving for two weeks.  Wait to be cleared by Dr Phineas Real at your first post op check.  Car rides (short) are ok after several days at home, as long as you are not having significant pain, but no traveling out of town.  ? You may shower, but no baths.  Walking up and down stairs is ok.  No heavy lifting, prolonged standing, repeated bending or any working out until your first post op check.  ? Rest frequently, listen to your body and do not push yourself and overdo it.  ? Call if:  o Your pain medication does not seem strong enough. o Worsening pain or abdominal bloating o Persistent nausea or vomiting o Difficulty with urination or bowel movements. o Temperature of 101 degrees or higher. o Bleeding heavier then staining (clots or period type flow). o Incisions become red, tender or begin to drain. o You have any questions or concerns.

## 2016-02-13 NOTE — Plan of Care (Signed)
Problem: Nutrition: Goal: Adequate nutrition will be maintained Outcome: Completed/Met Date Met: 02/13/16 No consult needed  Diet with fiber discussed with pt

## 2016-02-13 NOTE — Progress Notes (Signed)
Catherine [name] [birth year] [id number]   2 Days Post-Op Procedure(s) (LRB): EXPLORATORY LAPAROTOMY (N/A) MYOMECTOMY (N/A)  Subjective: Patient reports no acute distress, pain severity reported mild, Yes.   taking PO, foley catheter out, Yes.   voiding, Yes.   ambulating, Yes.   passing flatus  Objective: Vital signs in last 24 hours: Temp:  [98.3 F (36.8 C)-98.6 F (37 C)] 98.4 F (36.9 C) (08/31 0534) Pulse Rate:  [65-90] 79 (08/31 0534) Resp:  [16-20] 18 (08/31 0534) BP: (94-135)/(65-82) 118/80 (08/31 0534) SpO2:  [100 %] 100 % (08/31 0534) Last BM Date: 02/11/16    EXAM General: awake, no distress Resp: clear to auscultation bilaterally Cardio: regular rate and rhythm GI: soft, mildly tender, bowel sounds active, dressing Lower Extremities: Without swelling or tenderness Vaginal Bleeding: Reported scant   Lab Results:   Recent Labs  02/12/16 0555  WBC 8.4  HGB 9.6*  HCT 30.0*  PLT 238    Assessment: s/p Procedure(s): EXPLORATORY LAPAROTOMY MYOMECTOMY: progressing well, ready for discharge.  Plan: Discharge home today.  Precautions, instructions and follow up were discussed with the patient. To remove dressing in 2 days.  Prescriptions provided per AVS.  Patient to call the office to arrange a post-operative appointmant in 2 weeks.    Anastasio Auerbach MD, 7:57 AM 02/13/2016

## 2016-02-25 ENCOUNTER — Encounter: Payer: Self-pay | Admitting: Gynecology

## 2016-02-25 ENCOUNTER — Ambulatory Visit (INDEPENDENT_AMBULATORY_CARE_PROVIDER_SITE_OTHER): Payer: BLUE CROSS/BLUE SHIELD | Admitting: Gynecology

## 2016-02-25 VITALS — BP 118/76

## 2016-02-25 DIAGNOSIS — Z9889 Other specified postprocedural states: Secondary | ICD-10-CM

## 2016-02-25 NOTE — Progress Notes (Signed)
    Keimani Pair [place] Dec 07, 1985 [id number]        30 y.o.  G0P0000 presents for her first postoperative visit status post multiple myomectomy 3 weeks ago.  Is doing well. She does note some rectal pressure but otherwise doing well voiding without difficulty and having regular bowel movements. Weaning herself off of her pain medication.  Past medical history,surgical history, problem list, medications, allergies, family history and social history were all reviewed and documented in the EPIC chart.  Directed ROS with pertinent positives and negatives documented in the history of present illness/assessment and plan.  Exam: Vitals:   02/25/16 1037  BP: 118/76   General appearance:  Normal Abdomen soft nontender without masses guarding rebound. Incision healing nicely.  Assessment/Plan:  30 y.o. G0P00 doing well status post multiple myomectomy. Reviewed benign pathology and pictures from the surgery. She's having some rectal pressure which may be due to a small hematoma. I did not do a pelvic due to her virginal status limiting the quality of the exam. Having regular bowel movements and voiding without issue. Will monitor for now. Will slowly resume normal activities. Will maintain stool softeners to have regular softer bowel movements. Start back on iron daily to help recover her hemoglobin. Follow up in 2 weeks for her next postoperative visit.    Anastasio Auerbach MD, 11:01 AM 02/25/2016

## 2016-02-25 NOTE — Patient Instructions (Addendum)
Follow-up in 2 weeks for your next postoperative visit. 

## 2016-03-12 ENCOUNTER — Encounter: Payer: Self-pay | Admitting: Gynecology

## 2016-03-12 ENCOUNTER — Ambulatory Visit (INDEPENDENT_AMBULATORY_CARE_PROVIDER_SITE_OTHER): Payer: BLUE CROSS/BLUE SHIELD | Admitting: Gynecology

## 2016-03-12 VITALS — BP 110/70

## 2016-03-12 DIAGNOSIS — Z9889 Other specified postprocedural states: Secondary | ICD-10-CM

## 2016-03-12 NOTE — Patient Instructions (Signed)
Follow up January 2018 when due for annual exam

## 2016-03-12 NOTE — Progress Notes (Signed)
    Solon Sandoval Ritter 1986/02/13 [id number]        30 y.o.  G0P0000 presents for postoperative visit status post abdominal multiple myomectomy. Has done well without complaints. Did note rectal pressure at her last postoperative visit which has resolved.  Past medical history,surgical history, problem list, medications, allergies, family history and social history were all reviewed and documented in the EPIC chart.  Directed ROS with pertinent positives and negatives documented in the history of present illness/assessment and plan.  Exam: Caryn Bee assistant Vitals:   03/12/16 0947  BP: 110/70   General appearance:  Normal Abdomen soft nontender without masses guarding rebound. Incision healed nicely. Small suture noted at the left lateral portion of the incision at the not site. This was elevated and excised.  Assessment/Plan:  [name] with normal postoperative visit. Patient has resumed all normal activities and is doing well. Will follow up January 2018 when due for annual exam, sooner if any issues.    Anastasio Auerbach MD, 9:57 AM 03/12/2016

## 2016-05-05 ENCOUNTER — Ambulatory Visit (INDEPENDENT_AMBULATORY_CARE_PROVIDER_SITE_OTHER): Payer: BLUE CROSS/BLUE SHIELD

## 2016-05-05 ENCOUNTER — Ambulatory Visit (INDEPENDENT_AMBULATORY_CARE_PROVIDER_SITE_OTHER): Payer: BLUE CROSS/BLUE SHIELD | Admitting: Family Medicine

## 2016-05-05 VITALS — BP 110/68 | HR 87 | Temp 98.4°F | Resp 18 | Ht 62.0 in | Wt 196.8 lb

## 2016-05-05 DIAGNOSIS — M7631 Iliotibial band syndrome, right leg: Secondary | ICD-10-CM | POA: Diagnosis not present

## 2016-05-05 DIAGNOSIS — M25561 Pain in right knee: Secondary | ICD-10-CM

## 2016-05-05 MED ORDER — MELOXICAM 15 MG PO TABS: 15.0000 mg | ORAL_TABLET | Freq: Every day | ORAL | 0 refills | Status: DC

## 2016-05-05 NOTE — Progress Notes (Addendum)
Subjective:  By signing my name below, I, Catherine [name], attest that this documentation has been prepared under the direction and in the presence of Delman Cheadle, MD Electronically Signed: Ladene Artist, ED Scribe 05/05/2016 at 9:49 AM.   Patient ID: Catherine [name], female    DOB: 04-May-1986, 30 y.o.   MRN: [id number] Chief Complaint  Patient presents with  . Knee Pain    RIGHT   HPI HPI Comments: Catherine [name] is a 30 y.o. female who presents to the [facility] complaining of ongoing right knee pain for the past 6 months. Pt reports the most pain over the distal, lateral right knee with walking up stairs and running. She states that she recently gained weight after having a myomectomy and started running 1 mile on a treadmill on incline approximately 3 weeks ago for exercise. Pt denies known injury. She has tried applying ice and Bengay without significant relief. Pt denies joint swelling and redness.   Past Medical History:  Diagnosis Date  . Allergy   . Anemia   . GERD (gastroesophageal reflux disease)    with spicy food - no med  . Headache   . Scoliosis    Current Outpatient Prescriptions on File Prior to Visit  Medication Sig Dispense Refill  . IRON PO Take 1 tablet by mouth daily.     . norethindrone-ethinyl estradiol (MICROGESTIN,JUNEL,LOESTRIN) 1-20 MG-MCG tablet Take 1 tablet by mouth daily. 1 Package 11  . oxyCODONE-acetaminophen (ROXICET) 5-325 MG tablet Take 1 tablet by mouth every 4 (four) hours as needed for severe pain. (Patient not taking: Reported on 05/05/2016) 20 tablet 0   No current facility-administered medications on file prior to visit.    No Known Allergies  Review of Systems  Constitutional: Positive for activity change. Negative for chills, fever and unexpected weight change.  Musculoskeletal: Positive for arthralgias and gait problem. Negative for back pain, joint swelling and myalgias.  Skin: Negative for color change  and rash.  Neurological: Negative for weakness and numbness.   BP 110/68 (BP Location: Right Arm, Patient Position: Sitting, Cuff Size: Small)   Pulse 87   Temp 98.4 F (36.9 C) (Oral)   Resp 18   Ht 5\' 2"  (1.575 m)   Wt 196 lb 12.8 oz (89.3 kg)   LMP 04/11/2016   SpO2 99%   BMI 36.00 kg/m     Objective:   Physical Exam  Constitutional: She is oriented to person, place, and time. She appears well-developed and well-nourished. No distress.  HENT:  Head: Normocephalic and atraumatic.  Eyes: Conjunctivae and EOM are normal.  Neck: Neck supple. No tracheal deviation present.  Cardiovascular: Normal rate.   Pulmonary/Chest: Effort normal. No respiratory distress.  Musculoskeletal: Normal range of motion.  No swelling in popliteal fossa. No tenderness in medial or lateral joint line. Mild crepitus in both knees. FROM. No laxity with varus or vagus stress. Patella stable. Negative Mcmurray. Negative anterior and posterior drawer. No pain over patellar tendon. Pain over head of fibula.   Neurological: She is alert and oriented to person, place, and time.  Skin: Skin is warm and dry.  Psychiatric: She has a normal mood and affect. Her behavior is normal.  Nursing note and vitals reviewed.  Dg Knee 1-2 Views Right  Result Date: 05/05/2016 CLINICAL DATA:  Several month history of worsening pain over the head of fibula EXAM: RIGHT KNEE - 1-2 VIEW COMPARISON:  None. FINDINGS: No evidence of fracture, dislocation, or  joint effusion. No evidence of arthropathy or other focal bone abnormality. Soft tissues are unremarkable. IMPRESSION: Negative. Electronically Signed   By: Misty Stanley M.D.   On: 05/05/2016 10:17      Assessment & Plan:   1. Lateral knee pain, right   2. Iliotibial band syndrome, right leg     Orders Placed This Encounter  Procedures  . DG Knee 1-2 Views Right    Please do standing    Standing Status:   Future    Number of Occurrences:   1    Standing Expiration  Date:   05/05/2017    Order Specific Question:   Reason for Exam (SYMPTOM  OR DIAGNOSIS REQUIRED)    Answer:   pain over head of fibula for sev mos, worsening going upstairs and hills, nki    Order Specific Question:   Is the patient pregnant?    Answer:   No    Order Specific Question:   Preferred imaging location?    Answer:   External  . Ambulatory referral to Physical Therapy    Referral Priority:   Routine    Referral Type:   Physical Medicine    Referral Reason:   Specialty Services Required    Requested Specialty:   Physical Therapy    Number of Visits Requested:   1    Meds ordered this encounter  Medications  . meloxicam (MOBIC) 15 MG tablet    Sig: Take 1 tablet (15 mg total) by mouth daily.    Dispense:  30 tablet    Refill:  0    I personally performed the services described in this documentation, which was scribed in my presence. The recorded information has been reviewed and considered, and addended by me as needed.   Delman Cheadle, M.D.  [facility] [address] [place], [place] [address] [phone] phone [phone] fax  05/13/16 8:39 AM

## 2016-05-05 NOTE — Patient Instructions (Signed)
     IF you received an x-ray today, you will receive an invoice from [facility]. Please contact [facility] at [phone] with questions or concerns regarding your invoice.   IF you received labwork today, you will receive an invoice from [facility]. Please contact [facility] at [phone] with questions or concerns regarding your invoice.   Our billing staff will not be able to assist you with questions regarding bills from these companies.  You will be contacted with the lab results as soon as they are available. The fastest way to get your results is to activate your My Chart account. Instructions are located on the last page of this paperwork. If you have not heard from us regarding the results in 2 weeks, please contact this office.      

## 2016-05-28 DIAGNOSIS — M25561 Pain in right knee: Secondary | ICD-10-CM | POA: Diagnosis not present

## 2016-06-01 DIAGNOSIS — M25561 Pain in right knee: Secondary | ICD-10-CM | POA: Diagnosis not present

## 2016-06-03 DIAGNOSIS — M25561 Pain in right knee: Secondary | ICD-10-CM | POA: Diagnosis not present

## 2016-06-09 DIAGNOSIS — M25561 Pain in right knee: Secondary | ICD-10-CM | POA: Diagnosis not present

## 2016-06-11 DIAGNOSIS — M25561 Pain in right knee: Secondary | ICD-10-CM | POA: Diagnosis not present

## 2016-06-15 DIAGNOSIS — D649 Anemia, unspecified: Secondary | ICD-10-CM

## 2016-06-15 HISTORY — DX: Anemia, unspecified: D64.9

## 2016-07-02 ENCOUNTER — Encounter: Payer: BLUE CROSS/BLUE SHIELD | Admitting: Gynecology

## 2016-07-06 ENCOUNTER — Encounter: Payer: Self-pay | Admitting: Gynecology

## 2016-07-06 ENCOUNTER — Ambulatory Visit (INDEPENDENT_AMBULATORY_CARE_PROVIDER_SITE_OTHER): Payer: BLUE CROSS/BLUE SHIELD | Admitting: Gynecology

## 2016-07-06 VITALS — BP 120/76 | Ht 65.0 in | Wt 198.0 lb

## 2016-07-06 DIAGNOSIS — Z01419 Encounter for gynecological examination (general) (routine) without abnormal findings: Secondary | ICD-10-CM | POA: Diagnosis not present

## 2016-07-06 DIAGNOSIS — Z13220 Encounter for screening for lipoid disorders: Secondary | ICD-10-CM

## 2016-07-06 DIAGNOSIS — N946 Dysmenorrhea, unspecified: Secondary | ICD-10-CM | POA: Diagnosis not present

## 2016-07-06 DIAGNOSIS — D509 Iron deficiency anemia, unspecified: Secondary | ICD-10-CM

## 2016-07-06 LAB — URINALYSIS W MICROSCOPIC + REFLEX CULTURE
BILIRUBIN URINE: NEGATIVE
Bacteria, UA: NONE SEEN [HPF]
Casts: NONE SEEN [LPF]
Crystals: NONE SEEN [HPF]
Glucose, UA: NEGATIVE
Hgb urine dipstick: NEGATIVE
LEUKOCYTES UA: NEGATIVE
NITRITE: NEGATIVE
PH: 5.5 (ref 5.0–8.0)
Protein, ur: NEGATIVE
SPECIFIC GRAVITY, URINE: 1.027 (ref 1.001–1.035)
Yeast: NONE SEEN [HPF]

## 2016-07-06 LAB — COMPREHENSIVE METABOLIC PANEL
ALBUMIN: 4 g/dL (ref 3.6–5.1)
ALK PHOS: 60 U/L (ref 33–115)
ALT: 40 U/L — AB (ref 6–29)
AST: 27 U/L (ref 10–30)
BUN: 14 mg/dL (ref 7–25)
CALCIUM: 9.4 mg/dL (ref 8.6–10.2)
CHLORIDE: 105 mmol/L (ref 98–110)
CO2: 23 mmol/L (ref 20–31)
Creat: 0.88 mg/dL (ref 0.50–1.10)
Glucose, Bld: 129 mg/dL — ABNORMAL HIGH (ref 65–99)
Potassium: 3.8 mmol/L (ref 3.5–5.3)
Sodium: 136 mmol/L (ref 135–146)
TOTAL PROTEIN: 7.3 g/dL (ref 6.1–8.1)
Total Bilirubin: 0.4 mg/dL (ref 0.2–1.2)

## 2016-07-06 LAB — CBC WITH DIFFERENTIAL/PLATELET
BASOS ABS: 0 {cells}/uL (ref 0–200)
Basophils Relative: 0 %
EOS ABS: 52 {cells}/uL (ref 15–500)
Eosinophils Relative: 1 %
HEMATOCRIT: 41.1 % (ref 35.0–45.0)
HEMOGLOBIN: 13.3 g/dL (ref 11.7–15.5)
LYMPHS ABS: 2392 {cells}/uL (ref 850–3900)
Lymphocytes Relative: 46 %
MCH: 26.4 pg — AB (ref 27.0–33.0)
MCHC: 32.4 g/dL (ref 32.0–36.0)
MCV: 81.5 fL (ref 80.0–100.0)
MONO ABS: 312 {cells}/uL (ref 200–950)
MONOS PCT: 6 %
Neutro Abs: 2444 cells/uL (ref 1500–7800)
Neutrophils Relative %: 47 %
Platelets: 149 10*3/uL (ref 140–400)
RBC: 5.04 MIL/uL (ref 3.80–5.10)
RDW: 15.4 % — ABNORMAL HIGH (ref 11.0–15.0)
WBC: 5.2 10*3/uL (ref 3.8–10.8)

## 2016-07-06 LAB — LIPID PANEL
CHOLESTEROL: 262 mg/dL — AB (ref <200)
HDL: 61 mg/dL (ref >50)
LDL Cholesterol: 184 mg/dL — ABNORMAL HIGH (ref <100)
TRIGLYCERIDES: 83 mg/dL (ref <150)
Total CHOL/HDL Ratio: 4.3 Ratio (ref <5.0)
VLDL: 17 mg/dL (ref <30)

## 2016-07-06 MED ORDER — NORETHINDRONE ACET-ETHINYL EST 1-20 MG-MCG PO TABS: 1.0000 | ORAL_TABLET | Freq: Every day | ORAL | 6 refills | Status: DC

## 2016-07-06 NOTE — Patient Instructions (Signed)
Take 2 packs of the birth control pill active pills in a row so that you would have a period every other month. If this does well then you can go to 3 packs in a row with no skips and have a period every 3 months.

## 2016-07-06 NOTE — Progress Notes (Signed)
    Catherine [name] [birth year] [id number]        30 y.o.  G0P0000 for annual exam.  Does note some dysmenorrhea with her menses but otherwise doing well. Status post abdominal multiple myomectomy this past year. Currently on oral contraceptives for menstrual suppression. Remains virginal.  Past medical history,surgical history, problem list, medications, allergies, family history and social history were all reviewed and documented as reviewed in the EPIC chart.  ROS:  Performed with pertinent positives and negatives included in the history, assessment and plan.   Additional significant findings :  None   Exam: Caryn Bee assistant Vitals:   07/06/16 1635  BP: 120/76  Weight: 198 lb (89.8 kg)  Height: 5\' 5"  (1.651 m)   Body mass index is 32.95 kg/m.  General appearance:  Normal affect, orientation and appearance. Skin: Grossly normal HEENT: Without gross lesions.  No cervical or supraclavicular adenopathy. Thyroid normal.  Lungs:  Clear without wheezing, rales or rhonchi Cardiac: RR, without RMG Abdominal:  Soft, nontender, without masses, guarding, rebound, organomegaly or hernia Breasts:  Examined lying and sitting without masses, retractions, discharge or axillary adenopathy. Pelvic:  Ext, BUS, Vagina normal noting virginal status  Cervix normal  Uterus grossly normal midline mobile nontender. Exam limited by abdominal girth/voluntary guarding  Adnexa without masses or tenderness    Anus and perineum normal    Assessment/Plan:  31 y.o. G0P0000 female for annual exam with regular menses, oral contraceptives.   1. Dysmenorrhea. Is having some cramping with her menses. Exam shows no significant regrowth of leiomyoma. Patient currently on Microgestin. Recommended she start with an every other month withdrawal and if does well we'll go to every third month withdrawal see if this will help with her dysmenorrhea. Offbrand labeling reviewed with her. She has any issues with  that she will call me. 2. Breast health. SBE monthly reviewed. Plan Baseline mammography at age 17 3. Pap smea/HPV 06/2015 negative. No Pap smear done today. No history of significant abnormal Pap smears. 4. Health maintenance. Baseline CBC, CMP, lipid profile, urinalysis ordered. Follow up 1 year, sooner as needed.   Anastasio Auerbach MD, 4:54 PM 07/06/2016

## 2016-07-07 ENCOUNTER — Other Ambulatory Visit: Payer: Self-pay | Admitting: Gynecology

## 2016-07-07 DIAGNOSIS — R945 Abnormal results of liver function studies: Secondary | ICD-10-CM

## 2016-07-07 DIAGNOSIS — R7989 Other specified abnormal findings of blood chemistry: Secondary | ICD-10-CM

## 2016-07-07 DIAGNOSIS — R7309 Other abnormal glucose: Secondary | ICD-10-CM

## 2016-07-07 DIAGNOSIS — E7800 Pure hypercholesterolemia, unspecified: Secondary | ICD-10-CM

## 2016-07-07 LAB — URINE CULTURE

## 2016-07-09 ENCOUNTER — Other Ambulatory Visit: Payer: BLUE CROSS/BLUE SHIELD

## 2016-07-09 DIAGNOSIS — R945 Abnormal results of liver function studies: Secondary | ICD-10-CM

## 2016-07-09 DIAGNOSIS — R7309 Other abnormal glucose: Secondary | ICD-10-CM | POA: Diagnosis not present

## 2016-07-09 DIAGNOSIS — E7800 Pure hypercholesterolemia, unspecified: Secondary | ICD-10-CM | POA: Diagnosis not present

## 2016-07-09 DIAGNOSIS — R7989 Other specified abnormal findings of blood chemistry: Secondary | ICD-10-CM | POA: Diagnosis not present

## 2016-07-09 LAB — COMPREHENSIVE METABOLIC PANEL
ALK PHOS: 55 U/L (ref 33–115)
ALT: 23 U/L (ref 6–29)
AST: 18 U/L (ref 10–30)
Albumin: 3.8 g/dL (ref 3.6–5.1)
BILIRUBIN TOTAL: 0.4 mg/dL (ref 0.2–1.2)
BUN: 11 mg/dL (ref 7–25)
CO2: 23 mmol/L (ref 20–31)
CREATININE: 0.74 mg/dL (ref 0.50–1.10)
Calcium: 8.9 mg/dL (ref 8.6–10.2)
Chloride: 108 mmol/L (ref 98–110)
GLUCOSE: 86 mg/dL (ref 65–99)
Potassium: 4.3 mmol/L (ref 3.5–5.3)
SODIUM: 137 mmol/L (ref 135–146)
TOTAL PROTEIN: 6.9 g/dL (ref 6.1–8.1)

## 2016-07-09 LAB — LIPID PANEL
CHOL/HDL RATIO: 4.2 ratio (ref <5.0)
CHOLESTEROL: 244 mg/dL — AB (ref <200)
HDL: 58 mg/dL (ref >50)
LDL Cholesterol: 173 mg/dL — ABNORMAL HIGH (ref <100)
Triglycerides: 67 mg/dL (ref <150)
VLDL: 13 mg/dL (ref <30)

## 2016-07-10 LAB — HEMOGLOBIN A1C
HEMOGLOBIN A1C: 5.4 % (ref <5.7)
MEAN PLASMA GLUCOSE: 108 mg/dL

## 2016-09-11 ENCOUNTER — Ambulatory Visit (INDEPENDENT_AMBULATORY_CARE_PROVIDER_SITE_OTHER): Payer: BLUE CROSS/BLUE SHIELD | Admitting: Physician Assistant

## 2016-09-11 VITALS — BP 119/83 | HR 102 | Temp 101.1°F | Resp 17 | Ht 64.25 in | Wt 191.0 lb

## 2016-09-11 DIAGNOSIS — R509 Fever, unspecified: Secondary | ICD-10-CM

## 2016-09-11 DIAGNOSIS — R109 Unspecified abdominal pain: Secondary | ICD-10-CM | POA: Diagnosis not present

## 2016-09-11 LAB — POCT CBC
GRANULOCYTE PERCENT: 49 % (ref 37–80)
HCT, POC: 40.4 % (ref 37.7–47.9)
HEMOGLOBIN: 13.3 g/dL (ref 12.2–16.2)
Lymph, poc: 1.5 (ref 0.6–3.4)
MCH: 26.6 pg — AB (ref 27–31.2)
MCHC: 32.9 g/dL (ref 31.8–35.4)
MCV: 80.8 fL (ref 80–97)
MID (cbc): 0.3 (ref 0–0.9)
MPV: 9.7 fL (ref 0–99.8)
POC Granulocyte: 1.7 — AB (ref 2–6.9)
POC LYMPH PERCENT: 43 %L (ref 10–50)
POC MID %: 8 % (ref 0–12)
Platelet Count, POC: 146 10*3/uL (ref 142–424)
RBC: 5 M/uL (ref 4.04–5.48)
RDW, POC: 14 %
WBC: 3.4 10*3/uL — AB (ref 4.6–10.2)

## 2016-09-11 LAB — POCT URINALYSIS DIP (MANUAL ENTRY)
Blood, UA: NEGATIVE
GLUCOSE UA: NEGATIVE
LEUKOCYTES UA: NEGATIVE
Nitrite, UA: NEGATIVE
Protein Ur, POC: 30 — AB
Spec Grav, UA: 1.025 (ref 1.030–1.035)
Urobilinogen, UA: 0.2 (ref ?–2.0)
pH, UA: 6.5 (ref 5.0–8.0)

## 2016-09-11 LAB — POC MICROSCOPIC URINALYSIS (UMFC): Mucus: ABSENT

## 2016-09-11 LAB — POCT INFLUENZA A/B
Influenza A, POC: POSITIVE — AB
Influenza B, POC: NEGATIVE

## 2016-09-11 LAB — POCT URINE PREGNANCY: Preg Test, Ur: NEGATIVE

## 2016-09-11 MED ORDER — IBUPROFEN 200 MG PO TABS
600.0000 mg | ORAL_TABLET | Freq: Once | ORAL | Status: AC
  Administered 2016-09-11: 600 mg via ORAL

## 2016-09-11 NOTE — Patient Instructions (Addendum)
  I would like you to take ibuprofen or tylenol for your pain and fever.  Check your temperature.  You can take tylenol 1000mg  every 8 hours.   Please hydrate well with 64 oz of water.     IF you received an x-ray today, you will receive an invoice from [facility]. Please contact [facility] at [phone] with questions or concerns regarding your invoice.   IF you received labwork today, you will receive an invoice from [facility]. Please contact [facility] at [phone] with questions or concerns regarding your invoice.   Our billing staff will not be able to assist you with questions regarding bills from these companies.  You will be contacted with the lab results as soon as they are available. The fastest way to get your results is to activate your My Chart account. Instructions are located on the last page of this paperwork. If you have not heard from [place] regarding the results in 2 weeks, please contact this office.

## 2016-09-11 NOTE — Progress Notes (Signed)
[facility] AT [place], [place] [address] [phone]  Date:  09/11/2016   Name:  Catherine [name]   DOB:  1986/03/02   MRN:  [id number]  PCP:  Kathlen Brunswick, PA-C    History of Present Illness:  Catherine [name] is a 31 y.o. female patient who presents to PCP with  Chief Complaint  Patient presents with  . Flank Pain    X 3 days, right side  . Headache    X Monday     Right sided pain 3 days that she describes as sharp pain.  No frequency, hematuria, or dysuria.  She has some nausea with vomiting.  Has bodyaches and a general headache.  She took excedrin.  She is sweating.  Nothing makes the pain better.   Sexually active last 15 years ago.  No diarrhea or black stoool.  She denies vaginal discharge.   She has no UR symptoms of sore throat, cough, or congestion.   No diarrhea or constipation. No dizziness.       Patient Active Problem List   Diagnosis Date Noted  . Leiomyoma 02/11/2016    Past Medical History:  Diagnosis Date  . Allergy   . Anemia   . GERD (gastroesophageal reflux disease)    with spicy food - no med  . Headache   . Scoliosis     Past Surgical History:  Procedure Laterality Date  . LAPAROTOMY N/A 02/11/2016   Procedure: EXPLORATORY LAPAROTOMY;  Surgeon: Anastasio Auerbach, MD;  Location: [facility];  Service: Gynecology;  Laterality: N/A;  . MYOMECTOMY N/A 02/11/2016   Procedure: MYOMECTOMY;  Surgeon: Anastasio Auerbach, MD;  Location: [facility];  Service: Gynecology;  Laterality: N/A;    Social History  Substance Use Topics  . Smoking status: Never Smoker  . Smokeless tobacco: Never Used  . Alcohol use 0.0 oz/week     Comment: Rare    Family History  Problem Relation Age of Onset  . Hypertension Mother     No Known Allergies  Medication list has been reviewed and updated.  Current Outpatient Prescriptions on File Prior to Visit  Medication Sig Dispense Refill  . norethindrone-ethinyl estradiol  (MICROGESTIN,JUNEL,LOESTRIN) 1-20 MG-MCG tablet Take 1 tablet by mouth daily. 2 Package 6   No current facility-administered medications on file prior to visit.     ROS ROS otherwise unremarkable unless listed above.  Physical Examination: BP 119/83   Pulse (!) 102   Temp (!) 101.1 F (38.4 C) (Oral)   Resp 17   Ht 5' 4.25" (1.632 m)   Wt 191 lb (86.6 kg)   SpO2 100%   BMI 32.53 kg/m  Ideal Body Weight: Weight in (lb) to have BMI = 25: 146.5  Physical Exam  Constitutional: She is oriented to person, place, and time. She appears well-developed and well-nourished. No distress.  HENT:  Head: Normocephalic and atraumatic.  Right Ear: External ear normal.  Left Ear: External ear normal.  Eyes: Conjunctivae and EOM are normal. Pupils are equal, round, and reactive to light.  Cardiovascular: Normal rate.   Pulmonary/Chest: Effort normal. No respiratory distress.  Abdominal: Soft. Normal appearance and bowel sounds are normal. There is tenderness in the right lower quadrant and suprapubic area.  Tenderness is more axillary and right sided and varied.    Musculoskeletal:  No vertebral tenderness.  Neurological: She is alert and oriented to person, place, and time.  Skin: She is not diaphoretic.  Psychiatric: She has  a normal mood and affect. Her behavior is normal.    Results for orders placed or performed in visit on 09/11/16  POCT urinalysis dipstick  Result Value Ref Range   Color, UA yellow yellow   Clarity, UA clear clear   Glucose, UA negative negative   Bilirubin, UA small (A) negative   Ketones, POC UA trace (5) (A) negative   Spec Grav, UA 1.025 1.030 - 1.035   Blood, UA negative negative   pH, UA 6.5 5.0 - 8.0   Protein Ur, POC =30 (A) negative   Urobilinogen, UA 0.2 Negative - 2.0   Nitrite, UA Negative Negative   Leukocytes, UA Negative Negative  POCT CBC  Result Value Ref Range   WBC 3.4 (A) 4.6 - 10.2 K/uL   Lymph, poc 1.5 0.6 - 3.4   POC LYMPH PERCENT  43.0 10 - 50 %L   MID (cbc) 0.3 0 - 0.9   POC MID % 8.0 0 - 12 %M   POC Granulocyte 1.7 (A) 2 - 6.9   Granulocyte percent 49.0 37 - 80 %G   RBC 5.00 4.04 - 5.48 M/uL   Hemoglobin 13.3 12.2 - 16.2 g/dL   HCT, POC 40.4 37.7 - 47.9 %   MCV 80.8 80 - 97 fL   MCH, POC 26.6 (A) 27 - 31.2 pg   MCHC 32.9 31.8 - 35.4 g/dL   RDW, POC 14.0 %   Platelet Count, POC 146 142 - 424 K/uL   MPV 9.7 0 - 99.8 fL  POCT urine pregnancy  Result Value Ref Range   Preg Test, Ur Negative Negative  POCT Microscopic Urinalysis ([facility])  Result Value Ref Range   WBC,UR,HPF,POC None None WBC/hpf   RBC,UR,HPF,POC None None RBC/hpf   Bacteria None None, Too numerous to count   Mucus Absent Absent   Epithelial Cells, UR Per Microscopy Few (A) None, Too numerous to count cells/hpf  POCT Influenza A/B  Result Value Ref Range   Influenza A, POC Positive (A) Negative   Influenza B, POC Negative Negative    Assessment and Plan: Catherine [name] is a 31 y.o. female who is here today for cc of abdoinal pain and headache. Temperature went to 98.6 with tylenol.  Perhaps initial symptoms of virus. Advised antipyretic use.   Alarming symptoms discussed to warrant immediate return or to the ED Colleague reviewed.   Low likelyhood of acute abdomen/appendicitis.   Advised for her to follow up with me in 5 days.   Abdominal pain, unspecified abdominal location - Plan: POCT urinalysis dipstick, POCT CBC, Lipid panel, POCT Microscopic Urinalysis ([facility])  Fever, unspecified fever cause - Plan: POCT CBC, POCT urine pregnancy, ibuprofen (ADVIL,MOTRIN) tablet 600 mg, POCT Microscopic Urinalysis ([facility]), POCT Influenza A/B  Ivar Drape, PA-C [facility] 4/1/201812:09 AM

## 2016-09-12 DIAGNOSIS — J111 Influenza due to unidentified influenza virus with other respiratory manifestations: Secondary | ICD-10-CM | POA: Insufficient documentation

## 2016-09-12 DIAGNOSIS — R509 Fever, unspecified: Secondary | ICD-10-CM | POA: Diagnosis not present

## 2016-09-12 DIAGNOSIS — Z79899 Other long term (current) drug therapy: Secondary | ICD-10-CM | POA: Insufficient documentation

## 2016-09-12 LAB — LIPID PANEL
CHOLESTEROL TOTAL: 231 mg/dL — AB (ref 100–199)
Chol/HDL Ratio: 4.8 ratio — ABNORMAL HIGH (ref 0.0–4.4)
HDL: 48 mg/dL (ref >39)
LDL CALC: 161 mg/dL — AB (ref 0–99)
TRIGLYCERIDES: 112 mg/dL (ref 0–149)
VLDL CHOLESTEROL CAL: 22 mg/dL (ref 5–40)

## 2016-09-13 ENCOUNTER — Emergency Department (HOSPITAL_COMMUNITY)
Admission: EM | Admit: 2016-09-13 | Discharge: 2016-09-13 | Disposition: A | Discharge status: Home / Self Care | Payer: BLUE CROSS/BLUE SHIELD | Attending: Emergency Medicine | Admitting: Emergency Medicine

## 2016-09-13 ENCOUNTER — Encounter (HOSPITAL_COMMUNITY): Payer: Self-pay | Admitting: Emergency Medicine

## 2016-09-13 DIAGNOSIS — J111 Influenza due to unidentified influenza virus with other respiratory manifestations: Secondary | ICD-10-CM

## 2016-09-13 DIAGNOSIS — R69 Illness, unspecified: Secondary | ICD-10-CM

## 2016-09-13 LAB — URINALYSIS, ROUTINE W REFLEX MICROSCOPIC
BILIRUBIN URINE: NEGATIVE
Glucose, UA: NEGATIVE mg/dL
Hgb urine dipstick: NEGATIVE
Ketones, ur: NEGATIVE mg/dL
Leukocytes, UA: NEGATIVE
NITRITE: NEGATIVE
Protein, ur: NEGATIVE mg/dL
SPECIFIC GRAVITY, URINE: 1.008 (ref 1.005–1.030)
pH: 6 (ref 5.0–8.0)

## 2016-09-13 LAB — COMPREHENSIVE METABOLIC PANEL
ALBUMIN: 3.8 g/dL (ref 3.5–5.0)
ALT: 38 U/L (ref 14–54)
ANION GAP: 8 (ref 5–15)
AST: 47 U/L — ABNORMAL HIGH (ref 15–41)
Alkaline Phosphatase: 66 U/L (ref 38–126)
BILIRUBIN TOTAL: 0.6 mg/dL (ref 0.3–1.2)
BUN: 9 mg/dL (ref 6–20)
CO2: 21 mmol/L — AB (ref 22–32)
Calcium: 8.9 mg/dL (ref 8.9–10.3)
Chloride: 106 mmol/L (ref 101–111)
Creatinine, Ser: 0.81 mg/dL (ref 0.44–1.00)
GFR calc Af Amer: >60 mL/min (ref >60)
GFR calc non Af Amer: >60 mL/min (ref >60)
GLUCOSE: 102 mg/dL — AB (ref 65–99)
Potassium: 3.7 mmol/L (ref 3.5–5.1)
Sodium: 135 mmol/L (ref 135–145)
TOTAL PROTEIN: 8.3 g/dL — AB (ref 6.5–8.1)

## 2016-09-13 LAB — LIPASE, BLOOD: Lipase: 37 U/L (ref 11–51)

## 2016-09-13 LAB — CBC WITH DIFFERENTIAL/PLATELET
BASOS PCT: 0 %
Basophils Absolute: 0 10*3/uL (ref 0.0–0.1)
EOS ABS: 0 10*3/uL (ref 0.0–0.7)
Eosinophils Relative: 0 %
HCT: 41.9 % (ref 36.0–46.0)
Hemoglobin: 14 g/dL (ref 12.0–15.0)
Lymphocytes Relative: 42 %
Lymphs Abs: 1.1 10*3/uL (ref 0.7–4.0)
MCH: 27 pg (ref 26.0–34.0)
MCHC: 33.4 g/dL (ref 30.0–36.0)
MCV: 80.9 fL (ref 78.0–100.0)
MONO ABS: 0.2 10*3/uL (ref 0.1–1.0)
MONOS PCT: 7 %
Neutro Abs: 1.3 10*3/uL — ABNORMAL LOW (ref 1.7–7.7)
Neutrophils Relative %: 51 %
PLATELETS: 153 10*3/uL (ref 150–400)
RBC: 5.18 MIL/uL — ABNORMAL HIGH (ref 3.87–5.11)
RDW: 14 % (ref 11.5–15.5)
WBC: 2.6 10*3/uL — ABNORMAL LOW (ref 4.0–10.5)

## 2016-09-13 LAB — POC URINE PREG, ED: Preg Test, Ur: NEGATIVE

## 2016-09-13 LAB — INFLUENZA PANEL BY PCR (TYPE A & B)
INFLAPCR: NEGATIVE
INFLBPCR: NEGATIVE

## 2016-09-13 MED ORDER — ACETAMINOPHEN 325 MG PO TABS
325.0000 mg | ORAL_TABLET | Freq: Once | ORAL | Status: AC
  Administered 2016-09-13: 325 mg via ORAL
  Filled 2016-09-13: qty 1

## 2016-09-13 MED ORDER — ACETAMINOPHEN 325 MG PO TABS
650.0000 mg | ORAL_TABLET | Freq: Once | ORAL | Status: AC | PRN
  Administered 2016-09-13: 650 mg via ORAL
  Filled 2016-09-13: qty 2

## 2016-09-13 MED ORDER — SODIUM CHLORIDE 0.9 % IV BOLUS (SEPSIS)
2000.0000 mL | Freq: Once | INTRAVENOUS | Status: AC
  Administered 2016-09-13: 2000 mL via INTRAVENOUS

## 2016-09-13 MED ORDER — KETOROLAC TROMETHAMINE 30 MG/ML IJ SOLN
30.0000 mg | Freq: Once | INTRAMUSCULAR | Status: AC
  Administered 2016-09-13: 30 mg via INTRAVENOUS
  Filled 2016-09-13: qty 1

## 2016-09-13 NOTE — ED Provider Notes (Signed)
[facility] DEPT Provider Note   By signing my name below, I, Bea Graff, attest that this documentation has been prepared under the direction and in the presence of Everlene Balls, MD. Electronically Signed: Bea Graff, ED Scribe. 09/13/16. 2:54 AM.    History   Chief Complaint Chief Complaint  Patient presents with  . Fever    The history is provided by the patient and medical records. No language interpreter was used.    Catherine [name] is an obese 31 y.o. female who presents to the Emergency Department complaining of fever that began six days ago. She reports associated chills, nausea, vomiting (1 episode two days ago), diarrhea, HA, generalized body aches. 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 She has taken Tylenol and Ibuprofen for pain with temporary relief. There are no modifying factors noted. She denies dysuria, hematuria. She denies any recent travel. She reports a myomectomy last year.   Past Medical History:  Diagnosis Date  . Allergy   . Anemia   . GERD (gastroesophageal reflux disease)    with spicy food - no med  . Headache   . Scoliosis     Patient Active Problem List   Diagnosis Date Noted  . Leiomyoma 02/11/2016    Past Surgical History:  Procedure Laterality Date  . LAPAROTOMY N/A 02/11/2016   Procedure: EXPLORATORY LAPAROTOMY;  Surgeon: Anastasio Auerbach, MD;  Location: [facility];  Service: Gynecology;  Laterality: N/A;  . MYOMECTOMY N/A 02/11/2016   Procedure: MYOMECTOMY;  Surgeon: Anastasio Auerbach, MD;  Location: [facility];  Service: Gynecology;  Laterality: N/A;    OB History    Gravida Para Term Preterm AB Living   0 0 0 0 0 0   SAB TAB Ectopic Multiple Live Births   0 0 0 0         Home Medications    Prior to Admission medications   Medication Sig Start Date End Date Taking? Authorizing Provider  acetaminophen (TYLENOL) 500 MG tablet Take 500 mg by mouth every 6 (six) hours as needed for  headache.   Yes Historical Provider, MD  ibuprofen (ADVIL,MOTRIN) 200 MG tablet Take 800 mg by mouth every 8 (eight) hours as needed for moderate pain.    Yes Historical Provider, MD  norethindrone-ethinyl estradiol (MICROGESTIN,JUNEL,LOESTRIN) 1-20 MG-MCG tablet Take 1 tablet by mouth daily. Patient not taking: Reported on 09/13/2016 07/06/16   Anastasio Auerbach, MD    Family History Family History  Problem Relation Age of Onset  . Hypertension Mother     Social History Social History  Substance Use Topics  . Smoking status: Never Smoker  . Smokeless tobacco: Never Used  . Alcohol use 0.0 oz/week     Comment: Rare     Allergies   Patient has no known allergies.   Review of Systems Review of Systems A complete 10 system review of systems was obtained and all systems are negative except as noted in the HPI and PMH.    Physical Exam Updated Vital Signs BP 123/78 (BP Location: Left Arm)   Pulse (!) 112   Temp (!) 101.4 F (38.6 C) (Oral)   Resp 18   Ht 5\' 2"  (1.575 m)   Wt 190 lb 12.8 oz (86.5 kg)   SpO2 100%   BMI 34.90 kg/m   Physical Exam  Constitutional: She is oriented to person, place, and time. She appears well-developed and well-nourished. No distress.  Tactile fever  HENT:  Head: Normocephalic and atraumatic.  Nose: Nose normal.  Mouth/Throat: Oropharynx is clear and moist. No oropharyngeal exudate.  Eyes: Conjunctivae and EOM are normal. Pupils are equal, round, and reactive to light. No scleral icterus.  Neck: Normal range of motion. Neck supple. No JVD present. No tracheal deviation present. No thyromegaly present.  Cardiovascular: Regular rhythm and normal heart sounds.  Tachycardia present.  Exam reveals no gallop and no friction rub.   No murmur heard. Pulmonary/Chest: Effort normal and breath sounds normal. No respiratory distress. She has no wheezes. She exhibits no tenderness.  Abdominal: Soft. Bowel sounds are normal. She exhibits no distension  and no mass. There is tenderness in the suprapubic area. There is no rebound and no guarding.  Musculoskeletal: Normal range of motion. She exhibits no edema or tenderness.  Lymphadenopathy:    She has no cervical adenopathy.  Neurological: She is alert and oriented to person, place, and time. No cranial nerve deficit. She exhibits normal muscle tone.  Skin: Skin is warm. No rash noted. She is diaphoretic. No erythema. No pallor.  Nursing note and vitals reviewed.    ED Treatments / Results   DIAGNOSTIC STUDIES: Oxygen Saturation is 100% on RA, normal by my interpretation.   COORDINATION OF CARE: 1:46 AM- Will order labs and Tylenol. Pt verbalizes understanding and agrees to plan.  Medications  acetaminophen (TYLENOL) tablet 650 mg (650 mg Oral Given 09/13/16 0043)  acetaminophen (TYLENOL) tablet 325 mg (325 mg Oral Given 09/13/16 0200)  sodium chloride 0.9 % bolus 2,000 mL (2,000 mLs Intravenous New Bag/Given 09/13/16 0214)  ketorolac (TORADOL) 30 MG/ML injection 30 mg (30 mg Intravenous Given 09/13/16 0213)   Labs (all labs ordered are listed, but only abnormal results are displayed) Labs Reviewed  URINE CULTURE  URINALYSIS, ROUTINE W REFLEX MICROSCOPIC  CBC WITH DIFFERENTIAL/PLATELET  COMPREHENSIVE METABOLIC PANEL  LIPASE, BLOOD  INFLUENZA PANEL BY PCR (TYPE A & B)  POC URINE PREG, ED    EKG  EKG Interpretation None       Radiology No results found.  Procedures Procedures (including critical care time)  Medications Ordered in ED Medications  acetaminophen (TYLENOL) tablet 650 mg (650 mg Oral Given 09/13/16 0043)  acetaminophen (TYLENOL) tablet 325 mg (325 mg Oral Given 09/13/16 0200)  sodium chloride 0.9 % bolus 2,000 mL (2,000 mLs Intravenous New Bag/Given 09/13/16 0214)  ketorolac (TORADOL) 30 MG/ML injection 30 mg (30 mg Intravenous Given 09/13/16 0213)     Initial Impression / Assessment and Plan / ED Course  I have reviewed the triage vital signs and the nursing  notes.  Pertinent labs & imaging results that were available during my care of the patient were reviewed by me and considered in my medical decision making (see chart for details).     Patient presents to the ED for fever and body aches.  She is febrle and tachycardic. Will check for flu as this is consistent. She was given IVF, tylenol, toradol. Will check labs as well and a UA for mild abdominal tenderness. Will reevaluate.  3:29 AM Labs are normal.  Patient states she feels much better after interventions.  Plan to fu with PCP within 3 datys.  VS are now normal. PAtient safe for Dc.    I personally performed the services described in this documentation, which was scribed in my presence. The recorded information has been reviewed and is accurate.     Final Clinical Impressions(s) / ED Diagnoses   Final diagnoses:  None    New Prescriptions New Prescriptions   No medications on file     Everlene Balls, MD 09/13/16 0330

## 2016-09-13 NOTE — ED Notes (Signed)
WENT COLLECT BLOOD SAMPLES RN WAS STARTING AN IV

## 2016-09-13 NOTE — ED Triage Notes (Signed)
Pt reports having fever along with body aches and chills for the last week. Last Motrin taken at 1300 on 09/12/16. Pt also reporting headache.

## 2016-09-15 ENCOUNTER — Ambulatory Visit (INDEPENDENT_AMBULATORY_CARE_PROVIDER_SITE_OTHER): Payer: BLUE CROSS/BLUE SHIELD | Admitting: Physician Assistant

## 2016-09-15 VITALS — BP 112/80 | HR 118 | Temp 102.8°F | Resp 16 | Ht 63.0 in | Wt 193.0 lb

## 2016-09-15 DIAGNOSIS — R509 Fever, unspecified: Secondary | ICD-10-CM

## 2016-09-15 DIAGNOSIS — R070 Pain in throat: Secondary | ICD-10-CM | POA: Diagnosis not present

## 2016-09-15 LAB — POCT CBC
Granulocyte percent: 39.6 %G (ref 37–80)
HCT, POC: 39 % (ref 37.7–47.9)
HEMOGLOBIN: 13 g/dL (ref 12.2–16.2)
LYMPH, POC: 1.7 (ref 0.6–3.4)
MCH: 26.9 pg — AB (ref 27–31.2)
MCHC: 33.2 g/dL (ref 31.8–35.4)
MCV: 80.9 fL (ref 80–97)
MID (CBC): 0.1 (ref 0–0.9)
MPV: 9.6 fL (ref 0–99.8)
POC GRANULOCYTE: 1.2 — AB (ref 2–6.9)
POC LYMPH PERCENT: 56.2 %L — AB (ref 10–50)
POC MID %: 4.2 % (ref 0–12)
Platelet Count, POC: 126 10*3/uL — AB (ref 142–424)
RBC: 4.82 M/uL (ref 4.04–5.48)
RDW, POC: 14 %
WBC: 3 10*3/uL — AB (ref 4.6–10.2)

## 2016-09-15 LAB — POCT RAPID STREP A (OFFICE): Rapid Strep A Screen: NEGATIVE

## 2016-09-15 LAB — URINE CULTURE

## 2016-09-15 MED ORDER — IBUPROFEN 600 MG PO TABS: 600.0000 mg | ORAL_TABLET | Freq: Three times a day (TID) | ORAL | 0 refills | Status: DC | PRN

## 2016-09-15 NOTE — Patient Instructions (Addendum)
  Continue to take the anti-pyretics. You can take 800mg  of ibuprofen every 8 hours, with food. I would like to see you in 3 days.    IF you received an x-ray today, you will receive an invoice from [facility]. Please contact [facility] at [phone] with questions or concerns regarding your invoice.   IF you received labwork today, you will receive an invoice from [facility]. Please contact [facility] at [phone] with questions or concerns regarding your invoice.   Our billing staff will not be able to assist you with questions regarding bills from these companies.  You will be contacted with the lab results as soon as they are available. The fastest way to get your results is to activate your My Chart account. Instructions are located on the last page of this paperwork. If you have not heard from [place] regarding the results in 2 weeks, please contact this office.

## 2016-09-15 NOTE — Progress Notes (Signed)
PRIMARY CARE AT [place], [place] [address] [phone]  Date:  09/15/2016   Name:  Catherine [name]   DOB:  19-Dec-1985   MRN:  [id number]  PCP:  Kathlen Brunswick, PA-C    History of Present Illness:  Catherine [name] is a 31 y.o. female patient who presents to PCP with  Chief Complaint  Patient presents with  . Fever  . Generalized Body Aches     Patient returns here for recheck of fever generalized body aches, and abdominal pain.  She was seen by me 4 days ago for fever, generalized body aches, and abdominal pain.  This appeared viral and I advised anti-pyretic use and hydration.  She went to the ED the following day with same symptoms.  They dxd an influenza-like illness, with IVF and anti-pyretic, which she felt improved.  Today she reports that she continues to have fever she is taking both 1 ibuprofen/1tylenol at the same time.  Her fever will resolve for only a few hours, but then returns.  She has a generalized headache fever and chills.  She has no abdominal pain at this time.   She arrives with fever of 102. 8.  Last tylenol/ibuprofen administered 10 hours ago.   Hydrating well with at least 64oz of water per day.   Patient Active Problem List   Diagnosis Date Noted  . Leiomyoma 02/11/2016    Past Medical History:  Diagnosis Date  . Allergy   . Anemia   . GERD (gastroesophageal reflux disease)    with spicy food - no med  . Headache   . Scoliosis     Past Surgical History:  Procedure Laterality Date  . LAPAROTOMY N/A 02/11/2016   Procedure: EXPLORATORY LAPAROTOMY;  Surgeon: Anastasio Auerbach, MD;  Location: [facility];  Service: Gynecology;  Laterality: N/A;  . MYOMECTOMY N/A 02/11/2016   Procedure: MYOMECTOMY;  Surgeon: Anastasio Auerbach, MD;  Location: [facility];  Service: Gynecology;  Laterality: N/A;    Social History  Substance Use Topics  . Smoking status: Never Smoker  . Smokeless tobacco: Never Used  . Alcohol use 0.0 oz/week   Comment: Rare    Family History  Problem Relation Age of Onset  . Hypertension Mother     No Known Allergies  Medication list has been reviewed and updated.  Current Outpatient Prescriptions on File Prior to Visit  Medication Sig Dispense Refill  . acetaminophen (TYLENOL) 500 MG tablet Take 500 mg by mouth every 6 (six) hours as needed for headache.    . ibuprofen (ADVIL,MOTRIN) 200 MG tablet Take 800 mg by mouth every 8 (eight) hours as needed for moderate pain.     Marland Kitchen norethindrone-ethinyl estradiol (MICROGESTIN,JUNEL,LOESTRIN) 1-20 MG-MCG tablet Take 1 tablet by mouth daily. 2 Package 6   No current facility-administered medications on file prior to visit.     ROS ROS otherwise unremarkable unless listed above.  Physical Examination: BP 112/80 (BP Location: Right Arm, Patient Position: Sitting, Cuff Size: Large)   Pulse (!) 118   Temp (!) 102.8 F (39.3 C) (Oral)   Resp 16   Ht '5\' 3"'$  (1.6 m)   Wt 193 lb (87.5 kg)   SpO2 96%   BMI 34.19 kg/m  Ideal Body Weight: Weight in (lb) to have BMI = 25: 140.8  Physical Exam  Constitutional: She is oriented to person, place, and time. She appears well-developed and well-nourished. No distress.  HENT:  Head: Normocephalic and atraumatic.  Right  Ear: External ear normal.  Left Ear: External ear normal.  Mouth/Throat: Posterior oropharyngeal erythema present.  Eyes: Conjunctivae and EOM are normal. Pupils are equal, round, and reactive to light.  Cardiovascular: Normal rate.   Pulmonary/Chest: Effort normal. No respiratory distress.  Neurological: She is alert and oriented to person, place, and time.  Skin: She is not diaphoretic.  Psychiatric: She has a normal mood and affect. Her behavior is normal.    Results for orders placed or performed in visit on 09/15/16  POCT CBC  Result Value Ref Range   WBC 3.0 (A) 4.6 - 10.2 K/uL   Lymph, poc 1.7 0.6 - 3.4   POC LYMPH PERCENT 56.2 (A) 10 - 50 %L   MID (cbc) 0.1 0 - 0.9   POC  MID % 4.2 0 - 12 %M   POC Granulocyte 1.2 (A) 2 - 6.9   Granulocyte percent 39.6 37 - 80 %G   RBC 4.82 4.04 - 5.48 M/uL   Hemoglobin 13.0 12.2 - 16.2 g/dL   HCT, POC 39.0 37.7 - 47.9 %   MCV 80.9 80 - 97 fL   MCH, POC 26.9 (A) 27 - 31.2 pg   MCHC 33.2 31.8 - 35.4 g/dL   RDW, POC 14.0 %   Platelet Count, POC 126 (A) 142 - 424 K/uL   MPV 9.6 0 - 99.8 fL  POCT rapid strep A  Result Value Ref Range   Rapid Strep A Screen Negative Negative   Temperature rechecked at 99.1  Assessment and Plan: Catherine [name] is a 31 y.o. female who is here today for cc of fever and headache. Given 600 mg of ibuprofen with relief of her fever to 99.1 in office.  This continues to appear viral.  I have had a colleague recheck her as well.  Labs obtained for both EBV and HIV at this time.   Advised to take 800 mg of ibuprofen with food every 8 hours.  I do not think she is treating her fever enough at this time.   rtc in 2 days for recheck.    Fever, unspecified fever cause - Plan: POCT CBC, CMP14+EGFR, Epstein-Barr virus VCA antibody panel, HIV antibody, DISCONTINUED: ibuprofen (ADVIL,MOTRIN) 600 MG tablet  Throat pain - Plan: POCT rapid strep A  Ivar Drape, PA-C [facility] 4/4/20189:32 AM

## 2016-09-16 LAB — CMP14+EGFR
ALBUMIN: 4 g/dL (ref 3.5–5.5)
ALK PHOS: 78 IU/L (ref 39–117)
ALT: 58 IU/L — ABNORMAL HIGH (ref 0–32)
AST: 75 IU/L — AB (ref 0–40)
Albumin/Globulin Ratio: 1.2 (ref 1.2–2.2)
BILIRUBIN TOTAL: 0.2 mg/dL (ref 0.0–1.2)
BUN / CREAT RATIO: 5 — AB (ref 9–23)
BUN: 4 mg/dL — ABNORMAL LOW (ref 6–20)
CHLORIDE: 100 mmol/L (ref 96–106)
CO2: 20 mmol/L (ref 18–29)
Calcium: 8.9 mg/dL (ref 8.7–10.2)
Creatinine, Ser: 0.84 mg/dL (ref 0.57–1.00)
GFR calc Af Amer: 107 mL/min/{1.73_m2} (ref >59)
GFR calc non Af Amer: 93 mL/min/{1.73_m2} (ref >59)
GLOBULIN, TOTAL: 3.3 g/dL (ref 1.5–4.5)
Glucose: 78 mg/dL (ref 65–99)
POTASSIUM: 4.7 mmol/L (ref 3.5–5.2)
SODIUM: 138 mmol/L (ref 134–144)
Total Protein: 7.3 g/dL (ref 6.0–8.5)

## 2016-09-17 ENCOUNTER — Other Ambulatory Visit: Payer: Self-pay | Admitting: Physician Assistant

## 2016-09-17 ENCOUNTER — Telehealth: Payer: Self-pay | Admitting: Physician Assistant

## 2016-09-17 NOTE — Progress Notes (Unsigned)
Can we see the whereabouts of my order for hiv and ebv?

## 2016-09-18 ENCOUNTER — Ambulatory Visit: Payer: BLUE CROSS/BLUE SHIELD

## 2016-09-21 LAB — EPSTEIN-BARR VIRUS VCA ANTIBODY PANEL
EBV NA IGG: 150 U/mL — AB (ref 0.0–17.9)
EBV VCA IGG: 217 U/mL — AB (ref 0.0–17.9)

## 2016-09-21 LAB — SPECIMEN STATUS REPORT

## 2016-11-01 NOTE — Telephone Encounter (Signed)
No note needed 

## 2016-12-12 ENCOUNTER — Emergency Department (HOSPITAL_COMMUNITY): Payer: BLUE CROSS/BLUE SHIELD

## 2016-12-12 ENCOUNTER — Emergency Department (HOSPITAL_COMMUNITY)
Admission: EM | Admit: 2016-12-12 | Discharge: 2016-12-12 | Disposition: A | Discharge status: Home / Self Care | Payer: BLUE CROSS/BLUE SHIELD | Attending: Emergency Medicine | Admitting: Emergency Medicine

## 2016-12-12 ENCOUNTER — Encounter: Payer: Self-pay | Admitting: Physician Assistant

## 2016-12-12 ENCOUNTER — Encounter (HOSPITAL_COMMUNITY): Payer: Self-pay | Admitting: Emergency Medicine

## 2016-12-12 ENCOUNTER — Ambulatory Visit (INDEPENDENT_AMBULATORY_CARE_PROVIDER_SITE_OTHER): Payer: BLUE CROSS/BLUE SHIELD | Admitting: Physician Assistant

## 2016-12-12 VITALS — BP 108/74 | HR 111 | Temp 99.1°F | Resp 17 | Ht 64.5 in | Wt 188.0 lb

## 2016-12-12 DIAGNOSIS — Z79899 Other long term (current) drug therapy: Secondary | ICD-10-CM | POA: Insufficient documentation

## 2016-12-12 DIAGNOSIS — R519 Headache, unspecified: Secondary | ICD-10-CM

## 2016-12-12 DIAGNOSIS — M542 Cervicalgia: Secondary | ICD-10-CM | POA: Diagnosis not present

## 2016-12-12 DIAGNOSIS — D709 Neutropenia, unspecified: Secondary | ICD-10-CM | POA: Diagnosis not present

## 2016-12-12 DIAGNOSIS — R51 Headache: Secondary | ICD-10-CM | POA: Diagnosis not present

## 2016-12-12 DIAGNOSIS — R509 Fever, unspecified: Secondary | ICD-10-CM

## 2016-12-12 LAB — POCT CBC
Granulocyte percent: 61.8 %G (ref 37–80)
HCT, POC: 35 % — AB (ref 37.7–47.9)
HEMOGLOBIN: 12 g/dL — AB (ref 12.2–16.2)
Lymph, poc: 0.9 (ref 0.6–3.4)
MCH, POC: 26.2 pg — AB (ref 27–31.2)
MCHC: 34.2 g/dL (ref 31.8–35.4)
MCV: 76.7 fL — AB (ref 80–97)
MID (cbc): 0.2 (ref 0–0.9)
MPV: 8.6 fL (ref 0–99.8)
PLATELET COUNT, POC: 172 10*3/uL (ref 142–424)
POC GRANULOCYTE: 1.7 — AB (ref 2–6.9)
POC LYMPH %: 32.2 % (ref 10–50)
POC MID %: 6 %M (ref 0–12)
RBC: 4.56 M/uL (ref 4.04–5.48)
RDW, POC: 13.7 %
WBC: 2.7 10*3/uL — AB (ref 4.6–10.2)

## 2016-12-12 LAB — CBC WITH DIFFERENTIAL/PLATELET
BASOS ABS: 0 10*3/uL (ref 0.0–0.1)
BASOS PCT: 1 %
EOS ABS: 0 10*3/uL (ref 0.0–0.7)
Eosinophils Relative: 0 %
HCT: 36.8 % (ref 36.0–46.0)
HEMOGLOBIN: 11.9 g/dL — AB (ref 12.0–15.0)
Lymphocytes Relative: 45 %
Lymphs Abs: 1.3 10*3/uL (ref 0.7–4.0)
MCH: 25.3 pg — ABNORMAL LOW (ref 26.0–34.0)
MCHC: 32.3 g/dL (ref 30.0–36.0)
MCV: 78.3 fL (ref 78.0–100.0)
Monocytes Absolute: 0.2 10*3/uL (ref 0.1–1.0)
Monocytes Relative: 6 %
Neutro Abs: 1.4 10*3/uL — ABNORMAL LOW (ref 1.7–7.7)
Neutrophils Relative %: 48 %
PLATELETS: 173 10*3/uL (ref 150–400)
RBC: 4.7 MIL/uL (ref 3.87–5.11)
RDW: 13.7 % (ref 11.5–15.5)
WBC: 2.9 10*3/uL — AB (ref 4.0–10.5)

## 2016-12-12 LAB — I-STAT CHEM 8, ED
BUN: 7 mg/dL (ref 6–20)
CALCIUM ION: 1.17 mmol/L (ref 1.15–1.40)
CHLORIDE: 106 mmol/L (ref 101–111)
Creatinine, Ser: 0.6 mg/dL (ref 0.44–1.00)
Glucose, Bld: 72 mg/dL (ref 65–99)
HEMATOCRIT: 37 % (ref 36.0–46.0)
Hemoglobin: 12.6 g/dL (ref 12.0–15.0)
Potassium: 3.1 mmol/L — ABNORMAL LOW (ref 3.5–5.1)
SODIUM: 141 mmol/L (ref 135–145)
TCO2: 24 mmol/L (ref 0–100)

## 2016-12-12 LAB — I-STAT BETA HCG BLOOD, ED (MC, WL, AP ONLY)

## 2016-12-12 MED ORDER — IBUPROFEN 200 MG PO TABS
600.0000 mg | ORAL_TABLET | Freq: Once | ORAL | Status: AC
Start: 2016-12-12 — End: 2016-12-12
  Administered 2016-12-12: 600 mg via ORAL

## 2016-12-12 MED ORDER — LIDOCAINE HCL (PF) 1 % IJ SOLN
5.0000 mL | Freq: Once | INTRAMUSCULAR | Status: DC
  Filled 2016-12-12: qty 30

## 2016-12-12 NOTE — ED Triage Notes (Signed)
Pt reports HA and fever for the past 6 days. Went to UC. Was given ibuprofen for fever. Vitals WNL in triage. Otherwise pt has pain on R lateral neck.

## 2016-12-12 NOTE — Progress Notes (Signed)
[name]  MRN: [id number] DOB: [birth year]  Subjective:  Catherine [name] is a 31 y.o. female who presents for evaluation of fever. She has had the fever for 6 days. Symptoms have been stable. Symptoms are described as fevers up to 101.0 degrees, and are worse in the morning. Associated symptoms are diarrhea, headache, nausea and neck pain. She has lost 10lbs since 06/2016. Patient denies  abdominal pain, body aches, diarrhea, fatigue, otitis symptoms, poor appetite, rash, URI symptoms, urinary tract symptoms and vomiting.  She has tried to alleviate the symptoms with ibuprofen with moderate relief. The patient has no known comorbidities (structural heart/valvular disease, prosthetic joints, immunocompromised state, recent dental work, known abscesses).She is up to date on all immunizations. Last ibuprofen was last night. She has no hx of malignancy.   She was seen in 09/2016 for the same thing. Labs normal. It eventually resolved after 5 days or so. Prior to that she had never had a similar episode. Of note, pt is from [place]. She has been in [employer] for 8 hours.   Review of Systems  Per HPI  Patient Active Problem List   Diagnosis Date Noted  . Leiomyoma 02/11/2016    Current Outpatient Prescriptions on File Prior to Visit  Medication Sig Dispense Refill  . acetaminophen (TYLENOL) 500 MG tablet Take 500 mg by mouth every 6 (six) hours as needed for headache.    . norethindrone-ethinyl estradiol (MICROGESTIN,JUNEL,LOESTRIN) 1-20 MG-MCG tablet Take 1 tablet by mouth daily. 2 Package 6  . ibuprofen (ADVIL,MOTRIN) 200 MG tablet Take 800 mg by mouth every 8 (eight) hours as needed for moderate pain.      No current facility-administered medications on file prior to visit.     No Known Allergies   Objective:  BP 108/74   Pulse (!) 111   Temp 99.1 F (37.3 C)   Resp 17   Ht 5' 4.5" (1.638 m)   Wt 188 lb (85.3 kg)   SpO2 98%   BMI 31.77 kg/m   Physical Exam    Constitutional: She is oriented to person, place, and time and well-developed, well-nourished, and in no distress.  HENT:  Head: Normocephalic and atraumatic.  Right Ear: Tympanic membrane, external ear and ear canal normal.  Left Ear: Tympanic membrane, external ear and ear canal normal.  Nose: Nose normal.  Mouth/Throat: Uvula is midline, oropharynx is clear and moist and mucous membranes are normal.  Eyes: Conjunctivae are normal.  Neck: Normal range of motion and full passive range of motion without pain. Neck supple. No spinous process tenderness and no muscular tenderness present. Normal range of motion present.  Cardiovascular: Regular rhythm and normal heart sounds.  Tachycardia present.   Pulmonary/Chest: Effort normal and breath sounds normal. She has no wheezes. She has no rales.  Abdominal: Soft. Bowel sounds are normal. There is no tenderness.  Lymphadenopathy:       Head (right side): Tonsillar adenopathy present. No submental, no submandibular, no preauricular, no posterior auricular and no occipital adenopathy present.       Head (left side): No submental, no submandibular, no tonsillar, no preauricular, no posterior auricular and no occipital adenopathy present.    She has cervical adenopathy.       Right cervical: Superficial cervical and posterior cervical adenopathy present. No deep cervical adenopathy present.      Left cervical: Superficial cervical adenopathy present. No deep cervical and no posterior cervical adenopathy present.       Right axillary:  No pectoral and no lateral adenopathy present.       Left axillary: No pectoral and no lateral adenopathy present.      Right: Supraclavicular adenopathy present. No inguinal and no epitrochlear adenopathy present.       Left: No inguinal, no supraclavicular and no epitrochlear adenopathy present.  Neurological: She is alert and oriented to person, place, and time. Gait normal.  Skin: Skin is warm. She is diaphoretic  (mildly).  Psychiatric: Affect normal.  Vitals reviewed.   Results for orders placed or performed in visit on 12/12/16 (from the past 24 hour(s))  POCT CBC     Status: Abnormal   Collection Time: 12/12/16  9:54 AM  Result Value Ref Range   WBC 2.7 (A) 4.6 - 10.2 K/uL   Lymph, poc 0.9 0.6 - 3.4   POC LYMPH PERCENT 32.2 10 - 50 %L   MID (cbc) 0.2 0 - 0.9   POC MID % 6.0 0 - 12 %M   POC Granulocyte 1.7 (A) 2 - 6.9   Granulocyte percent 61.8 37 - 80 %G   RBC 4.56 4.04 - 5.48 M/uL   Hemoglobin 12.0 (A) 12.2 - 16.2 g/dL   HCT, POC 35.0 (A) 37.7 - 47.9 %   MCV 76.7 (A) 80 - 97 fL   MCH, POC 26.2 (A) 27 - 31.2 pg   MCHC 34.2 31.8 - 35.4 g/dL   RDW, POC 13.7 %   Platelet Count, POC 172 142 - 424 K/uL   MPV 8.6 0 - 99.8 fL   Wt Readings from Last 3 Encounters:  12/12/16 188 lb (85.3 kg)  09/15/16 193 lb (87.5 kg)  09/12/16 190 lb 12.8 oz (86.5 kg)     Assessment and Plan :   1. Fever, unspecified fever cause Temperature decreased from 102.9 to 99.1 after 600mg  ibuprofen in office. Fever of unknown origin, which is the second occurrence for patient in the past 2 months. Pt appears stable on exam. Lymphadenopathy and tachycardia noted. Due to hx, PE findings, WBC of 2.7, and fever without identifiable source, pt warrants further emergent evaluation by the ED. DDx includes viral vs. malignant etiology. She does not warrant EMS transport. She has agreed to drive herself directly to [facility] ED. I contacted [facility], and discussed case with the ED charge nurse, [place]. - POCT CBC - ibuprofen (ADVIL,MOTRIN) tablet 600 mg; Take 3 tablets (600 mg total) by mouth once.   Tenna Delaine PA-C  [facility] 12/12/2016 11:06 AM

## 2016-12-12 NOTE — Patient Instructions (Addendum)
Please go immediately to the ED for further evaluation. Thank you for letting me participate in your health and well being.   IF you received an x-ray today, you will receive an invoice from [facility]. Please contact [facility] at [phone] with questions or concerns regarding your invoice.   IF you received labwork today, you will receive an invoice from [facility]. Please contact [facility] at [phone] with questions or concerns regarding your invoice.   Our billing staff will not be able to assist you with questions regarding bills from these companies.  You will be contacted with the lab results as soon as they are available. The fastest way to get your results is to activate your My Chart account. Instructions are located on the last page of this paperwork. If you have not heard from [place] regarding the results in 2 weeks, please contact this office.

## 2016-12-12 NOTE — ED Provider Notes (Signed)
[place] DEPT Provider Note   CSN: [id number] Arrival date & time: 12/12/16  1111     History   Chief Complaint Chief Complaint  Patient presents with  . Headache  . Fever    HPI Catherine [name] is a 31 y.o. female.  HPI  Pt comes in with cc of headache, fevers. PT sent here from the urgent care. Pt has no medical hx. She reports to me 5 day hx of headaches. Headaches are intermittent, and respond to ibuprofen / tylenol. Pt developed slight neck pain a day after the headache started. She also has been having fevers for 5 days. Pt has no associated nausea, vomiting, seizures, loss of consciousness or new visual complains, weakness, numbness, or gait instability. Pt has had dizziness when she gets up on occasion. Pt is originally from [place]. She  denies any recent travels. Pt is on birth control pills.    Past Medical History:  Diagnosis Date  . Allergy   . Anemia   . GERD (gastroesophageal reflux disease)    with spicy food - no med  . Headache   . Scoliosis     Patient Active Problem List   Diagnosis Date Noted  . Leiomyoma 02/11/2016    Past Surgical History:  Procedure Laterality Date  . LAPAROTOMY N/A 02/11/2016   Procedure: EXPLORATORY LAPAROTOMY;  Surgeon: Anastasio Auerbach, MD;  Location: [facility];  Service: Gynecology;  Laterality: N/A;  . MYOMECTOMY N/A 02/11/2016   Procedure: MYOMECTOMY;  Surgeon: Anastasio Auerbach, MD;  Location: [facility];  Service: Gynecology;  Laterality: N/A;    OB History    Gravida Para Term Preterm AB Living   0 0 0 0 0 0   SAB TAB Ectopic Multiple Live Births   0 0 0 0         Home Medications    Prior to Admission medications   Medication Sig Start Date End Date Taking? Authorizing Provider  ibuprofen (ADVIL,MOTRIN) 200 MG tablet Take 800 mg by mouth every 8 (eight) hours as needed for moderate pain.    Yes [provider]  norethindrone-ethinyl estradiol (MICROGESTIN,JUNEL,LOESTRIN) 1-20 MG-MCG  tablet Take 1 tablet by mouth daily. 07/06/16  Yes Fontaine, Belinda Block, MD    Family History Family History  Problem Relation Age of Onset  . Hypertension Mother     Social History Social History  Substance Use Topics  . Smoking status: Never Smoker  . Smokeless tobacco: Never Used  . Alcohol use 0.0 oz/week     Comment: Rare     Allergies   Patient has no known allergies.   Review of Systems Review of Systems  All other systems reviewed and are negative.    Physical Exam Updated Vital Signs BP 123/81   Pulse 60   Temp 98.5 F (36.9 C) (Oral)   Resp 17   Ht 5\' 3"  (1.6 m)   Wt 85.3 kg (188 lb)   SpO2 98%   BMI 33.30 kg/m   Physical Exam  Constitutional: She is oriented to person, place, and time. She appears well-developed.  HENT:  Head: Normocephalic and atraumatic.  Eyes: Conjunctivae and EOM are normal. Pupils are equal, round, and reactive to light.  Neck: Normal range of motion. Neck supple.  No meningeal signs / no meningismus  Cardiovascular: Normal rate, regular rhythm and normal heart sounds.   Pulmonary/Chest: Effort normal and breath sounds normal. No respiratory distress.  Abdominal: Soft. Bowel sounds are normal. She exhibits  no distension. There is no tenderness. There is no rebound and no guarding.  Neurological: She is alert and oriented to person, place, and time. No cranial nerve deficit. Coordination normal.  Skin: Skin is warm and dry.  Nursing note and vitals reviewed.    ED Treatments / Results  Labs (all labs ordered are listed, but only abnormal results are displayed) Labs Reviewed  CBC WITH DIFFERENTIAL/PLATELET - Abnormal; Notable for the following:       Result Value   WBC 2.9 (*)    Hemoglobin 11.9 (*)    MCH 25.3 (*)    Neutro Abs 1.4 (*)    All other components within normal limits  I-STAT CHEM 8, ED - Abnormal; Notable for the following:    Potassium 3.1 (*)    All other components within normal limits  CSF CULTURE    GRAM STAIN  I-STAT BETA HCG BLOOD, ED ([facility], [facility], [facility] ONLY)    EKG  EKG Interpretation None       Radiology Ct Head Wo Contrast  Result Date: 12/12/2016 CLINICAL DATA:  Headache and fever for 6 days. EXAM: CT HEAD WITHOUT CONTRAST TECHNIQUE: Contiguous axial images were obtained from the base of the skull through the vertex without intravenous contrast. COMPARISON:  None. FINDINGS: Brain: No evidence of acute infarction, hemorrhage, hydrocephalus, extra-axial collection or mass lesion/mass effect. Vascular: No hyperdense vessel or unexpected calcification. Skull: Normal. Negative for fracture or focal lesion. Sinuses/Orbits: No acute finding. Other: None. IMPRESSION: Negative noncontrast head CT. Electronically Signed   By: Earle Gell M.D.   On: 12/12/2016 15:50    Procedures Procedures (including critical care time)    Medications Ordered in ED Medications - No data to display   Initial Impression / Assessment and Plan / ED Course  I have reviewed the triage vital signs and the nursing notes.  Pertinent labs & imaging results that were available during my care of the patient were reviewed by me and considered in my medical decision making (see chart for details).  Clinical Course as of Dec 14 754  Sat Dec 12, 2016  1647 Pt wanted LP initially, now she wants to go home. I discussed with her that the suspicion for bacterial meningitis is low. Strict ER return precautions have been discussed, and patient is agreeing with the plan and is comfortable with the workup done and the recommendations from the ER.   [AN]  1648 WBC: (!) 2.9 [AN]  1648 Pt had mild neutropenia. Could be viral infection mediated. I will call heme now. Clinically, I am still fine with no LP, as meningitis suspicion is still low for a headache x 5 days with fevers. NEUT#: (!) 1.4 [AN]  1700 I spoke with Heme. Dr. Curlene Labrum not too concerned about immunosuppression, and will ensure patient gets a f/u if she is  not admitted.   We discussed the results of the CBC, and pt decided to try to get LP done.   [AN]  1800 LP failed on 3 attempts. Meningitis suspicion is still low. Outpatient LP ordered for Monday. I spoke with Radiology and confirmed that they will schedule the LP then. Patient was given the option of being admitted to the hospital, get the neutropenia workup started, see hematology and also get fluoro guided LP. Pt declined. 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 Pros and Cons of her decision discussed, and she lives with 2 siblings and would come to the ER if her symptoms are  getting worse. Strict ER return precautions have been discussed, and patient is agreeing with the plan and is comfortable with the workup done and the recommendations from the ER.    [AN]    Clinical Course User Index [AN] Varney Biles, MD      Final Clinical Impressions(s) / ED Diagnoses   Final diagnoses:  Bad headache  Neutropenia, unspecified type ([place])   PT comes in with cc of headaches x 4-5 days. PT is on birth control. She has had some neck pain as well and fevers. No recent travels.  Clinically, suspicion for meningitis or encephalitis is low. PT has no meningismus, headaches are intermittent and the symptoms have been now present for 5 days w.o and progression of neuro deficits.  Pt is on birth control. Thrombosis considered in the ddx. CT head ordered. Basic labs ordered.    New Prescriptions Discharge Medication List as of 12/12/2016  6:46 PM       Varney Biles, MD 12/13/16 [phone]

## 2016-12-12 NOTE — Discharge Instructions (Signed)
Your immune system seems to be weaker than it should be based on our results.  You have preferred to go home over admission - we have called hematologist and they will contact you for admission. The contact information for the hematologist is listed above - call them on Monday afternoon if you have not heard from them. We have scheduled a lumbar puncture for you under xrays tomorrow - we will call you from the hospital to get that set.  Please return to the ER if your symptoms worsen; you have increased pain, fevers, chills, inability to keep any medications down, confusion. Otherwise see the outpatient doctor as requested.  See your PCP in 2-3 days

## 2016-12-13 ENCOUNTER — Other Ambulatory Visit: Payer: Self-pay | Admitting: Oncology

## 2016-12-14 ENCOUNTER — Encounter: Payer: Self-pay | Admitting: Hematology

## 2016-12-17 ENCOUNTER — Ambulatory Visit (HOSPITAL_COMMUNITY)
Admission: RE | Admit: 2016-12-17 | Discharge: 2016-12-17 | Disposition: A | Discharge status: Home / Self Care | Payer: BLUE CROSS/BLUE SHIELD | Source: Ambulatory Visit | Attending: Emergency Medicine | Admitting: Emergency Medicine

## 2016-12-17 ENCOUNTER — Other Ambulatory Visit: Payer: Self-pay | Admitting: Diagnostic Radiology

## 2016-12-17 ENCOUNTER — Encounter: Payer: Self-pay | Admitting: Diagnostic Radiology

## 2016-12-17 DIAGNOSIS — R509 Fever, unspecified: Secondary | ICD-10-CM | POA: Diagnosis not present

## 2016-12-17 DIAGNOSIS — R51 Headache: Secondary | ICD-10-CM | POA: Diagnosis not present

## 2016-12-17 LAB — PROTEIN, CSF: TOTAL PROTEIN, CSF: 26 mg/dL (ref 15–45)

## 2016-12-17 LAB — CSF CELL COUNT WITH DIFFERENTIAL
RBC Count, CSF: 6 /mm3 — ABNORMAL HIGH
Tube #: 3
WBC CSF: 5 /mm3 (ref 0–5)

## 2016-12-17 LAB — GLUCOSE, CSF: GLUCOSE CSF: 60 mg/dL (ref 40–70)

## 2016-12-17 MED ORDER — LIDOCAINE HCL (PF) 1 % IJ SOLN
INTRAMUSCULAR | Status: AC
  Administered 2016-12-17: 5 mL
  Filled 2016-12-17: qty 5

## 2016-12-17 MED ORDER — LIDOCAINE HCL (PF) 1 % IJ SOLN
5.0000 mL | Freq: Once | INTRAMUSCULAR | Status: AC
  Administered 2016-12-17: 5 mL

## 2016-12-17 NOTE — Progress Notes (Signed)
Radiology  Uncomplicated LP at V4/2 with 22 G needle.  7cc clear CSF sent to lab.  JWatts MD

## 2016-12-17 NOTE — Progress Notes (Signed)
Report received from [facility] assumed at this time

## 2016-12-17 NOTE — Discharge Instructions (Signed)
Lumbar Puncture, Care After °Refer to this sheet in the next few weeks. These instructions provide you with information on caring for yourself after your procedure. Your health care provider may also give you more specific instructions. Your treatment has been planned according to current medical practices, but problems sometimes occur. Call your health care provider if you have any problems or questions after your procedure. °What can I expect after the procedure? °After your procedure, it is typical to have the following sensations: °· Mild discomfort or pain at the insertion site. °· Mild headache that is relieved with pain medicines. ° °Follow these instructions at home: ° °· Avoid lifting anything heavier than 10 lb (4.5 kg) for at least 12 hours after the procedure. °· Drink enough fluids to keep your urine clear or pale yellow. °Contact a health care provider if: °· You have fever or chills. °· You have nausea or vomiting. °· You have a headache that lasts for more than 2 days. °Get help right away if: °· You have any numbness or tingling in your legs. °· You are unable to control your bowel or bladder. °· You have bleeding or swelling in your back at the insertion site. °· You are dizzy or faint. °This information is not intended to replace advice given to you by your health care provider. Make sure you discuss any questions you have with your health care provider. °Document Released: 06/06/2013 Document Revised: 11/07/2015 Document Reviewed: 02/07/2013 °Elsevier Interactive Patient Education © 2017 Elsevier Inc. ° °

## 2016-12-20 LAB — CSF CULTURE: CULTURE: NO GROWTH

## 2016-12-20 LAB — CSF CULTURE W GRAM STAIN

## 2017-01-07 ENCOUNTER — Ambulatory Visit (HOSPITAL_BASED_OUTPATIENT_CLINIC_OR_DEPARTMENT_OTHER): Payer: BLUE CROSS/BLUE SHIELD | Admitting: Hematology

## 2017-01-07 ENCOUNTER — Ambulatory Visit (HOSPITAL_BASED_OUTPATIENT_CLINIC_OR_DEPARTMENT_OTHER): Payer: BLUE CROSS/BLUE SHIELD

## 2017-01-07 ENCOUNTER — Encounter: Payer: Self-pay | Admitting: Hematology

## 2017-01-07 VITALS — BP 117/86 | HR 84 | Temp 98.9°F | Resp 18 | Ht 63.0 in | Wt 179.9 lb

## 2017-01-07 DIAGNOSIS — D649 Anemia, unspecified: Secondary | ICD-10-CM

## 2017-01-07 DIAGNOSIS — R509 Fever, unspecified: Secondary | ICD-10-CM

## 2017-01-07 DIAGNOSIS — R590 Localized enlarged lymph nodes: Secondary | ICD-10-CM | POA: Diagnosis not present

## 2017-01-07 DIAGNOSIS — R634 Abnormal weight loss: Secondary | ICD-10-CM

## 2017-01-07 DIAGNOSIS — D72819 Decreased white blood cell count, unspecified: Secondary | ICD-10-CM

## 2017-01-07 DIAGNOSIS — E876 Hypokalemia: Secondary | ICD-10-CM

## 2017-01-07 LAB — CBC & DIFF AND RETIC
BASO%: 0.3 % (ref 0.0–2.0)
Basophils Absolute: 0 10*3/uL (ref 0.0–0.1)
EOS%: 1.3 % (ref 0.0–7.0)
Eosinophils Absolute: 0 10*3/uL (ref 0.0–0.5)
HEMATOCRIT: 38.6 % (ref 34.8–46.6)
HEMOGLOBIN: 12.1 g/dL (ref 11.6–15.9)
Immature Retic Fract: 12.3 % — ABNORMAL HIGH (ref 1.60–10.00)
LYMPH#: 1.2 10*3/uL (ref 0.9–3.3)
LYMPH%: 39.9 % (ref 14.0–49.7)
MCH: 25.3 pg (ref 25.1–34.0)
MCHC: 31.3 g/dL — ABNORMAL LOW (ref 31.5–36.0)
MCV: 80.6 fL (ref 79.5–101.0)
MONO#: 0.3 10*3/uL (ref 0.1–0.9)
MONO%: 8.8 % (ref 0.0–14.0)
NEUT%: 49.7 % (ref 38.4–76.8)
NEUTROS ABS: 1.5 10*3/uL (ref 1.5–6.5)
PLATELETS: 190 10*3/uL (ref 145–400)
RBC: 4.79 10*6/uL (ref 3.70–5.45)
RDW: 15.1 % — AB (ref 11.2–14.5)
RETIC %: 1.65 % (ref 0.70–2.10)
RETIC CT ABS: 79.04 10*3/uL (ref 33.70–90.70)
WBC: 3.1 10*3/uL — AB (ref 3.9–10.3)

## 2017-01-07 LAB — COMPREHENSIVE METABOLIC PANEL
ALBUMIN: 3.6 g/dL (ref 3.5–5.0)
ALK PHOS: 86 U/L (ref 40–150)
ALT: 102 U/L — ABNORMAL HIGH (ref 0–55)
ANION GAP: 8 meq/L (ref 3–11)
AST: 88 U/L — AB (ref 5–34)
BUN: 8.7 mg/dL (ref 7.0–26.0)
CALCIUM: 9.5 mg/dL (ref 8.4–10.4)
CO2: 23 mEq/L (ref 22–29)
CREATININE: 0.7 mg/dL (ref 0.6–1.1)
Chloride: 109 mEq/L (ref 98–109)
EGFR: >90 mL/min/{1.73_m2} (ref >90)
Glucose: 80 mg/dl (ref 70–140)
POTASSIUM: 4.1 meq/L (ref 3.5–5.1)
Sodium: 140 mEq/L (ref 136–145)
Total Bilirubin: 0.37 mg/dL (ref 0.20–1.20)
Total Protein: 8.1 g/dL (ref 6.4–8.3)

## 2017-01-07 LAB — FERRITIN: Ferritin: 274 ng/ml — ABNORMAL HIGH (ref 9–269)

## 2017-01-07 LAB — LACTATE DEHYDROGENASE: LDH: 395 U/L — AB (ref 125–245)

## 2017-01-07 LAB — IRON AND TIBC
%SAT: 32 % (ref 21–57)
Iron: 103 ug/dL (ref 41–142)
TIBC: 321 ug/dL (ref 236–444)
UIBC: 218 ug/dL (ref 120–384)

## 2017-01-07 LAB — TECHNOLOGIST REVIEW

## 2017-01-08 LAB — FOLATE RBC
FOLATE, HEMOLYSATE: 290.9 ng/mL
Folate, RBC: 801 ng/mL (ref >498)
Hematocrit: 36.3 % (ref 34.0–46.6)

## 2017-01-08 LAB — HEPATITIS B CORE ANTIBODY, TOTAL: HEP B C TOTAL AB: NEGATIVE

## 2017-01-08 LAB — HEPATITIS C ANTIBODY (REFLEX): HCV Ab: <0.1 s/co ratio (ref 0.0–0.9)

## 2017-01-08 LAB — HIV ANTIBODY (ROUTINE TESTING W REFLEX): HIV Screen 4th Generation wRfx: NONREACTIVE

## 2017-01-08 LAB — HAPTOGLOBIN: Haptoglobin: 104 mg/dL (ref 34–200)

## 2017-01-08 LAB — SEDIMENTATION RATE: SED RATE: 29 mm/h (ref 0–32)

## 2017-01-08 LAB — HEPATITIS B SURFACE ANTIGEN: HEP B S AG: NEGATIVE

## 2017-01-08 LAB — VITAMIN B12: VITAMIN B 12: 967 pg/mL (ref 232–1245)

## 2017-01-11 LAB — PARASITE EXAM, BLOOD

## 2017-01-11 LAB — ANTINUCLEAR ANTIBODIES, IFA: ANA Titer 1: NEGATIVE

## 2017-01-19 ENCOUNTER — Ambulatory Visit (HOSPITAL_COMMUNITY)
Admission: RE | Admit: 2017-01-19 | Discharge: 2017-01-19 | Disposition: A | Discharge status: Home / Self Care | Payer: BLUE CROSS/BLUE SHIELD | Source: Ambulatory Visit | Attending: Hematology | Admitting: Hematology

## 2017-01-19 ENCOUNTER — Ambulatory Visit (HOSPITAL_COMMUNITY): Payer: BLUE CROSS/BLUE SHIELD

## 2017-01-19 ENCOUNTER — Encounter (HOSPITAL_COMMUNITY): Payer: Self-pay

## 2017-01-19 DIAGNOSIS — N200 Calculus of kidney: Secondary | ICD-10-CM | POA: Diagnosis not present

## 2017-01-19 DIAGNOSIS — R634 Abnormal weight loss: Secondary | ICD-10-CM

## 2017-01-19 DIAGNOSIS — R509 Fever, unspecified: Secondary | ICD-10-CM

## 2017-01-19 DIAGNOSIS — K769 Liver disease, unspecified: Secondary | ICD-10-CM | POA: Diagnosis not present

## 2017-01-19 DIAGNOSIS — R590 Localized enlarged lymph nodes: Secondary | ICD-10-CM | POA: Diagnosis not present

## 2017-01-19 DIAGNOSIS — K115 Sialolithiasis: Secondary | ICD-10-CM | POA: Insufficient documentation

## 2017-01-19 DIAGNOSIS — R918 Other nonspecific abnormal finding of lung field: Secondary | ICD-10-CM | POA: Diagnosis not present

## 2017-01-19 DIAGNOSIS — D649 Anemia, unspecified: Secondary | ICD-10-CM | POA: Diagnosis not present

## 2017-01-19 DIAGNOSIS — D72819 Decreased white blood cell count, unspecified: Secondary | ICD-10-CM | POA: Diagnosis not present

## 2017-01-19 MED ORDER — IOPAMIDOL (ISOVUE-300) INJECTION 61%
INTRAVENOUS | Status: AC
  Filled 2017-01-19: qty 100

## 2017-01-19 MED ORDER — IOPAMIDOL (ISOVUE-300) INJECTION 61%
100.0000 mL | Freq: Once | INTRAVENOUS | Status: AC | PRN
  Administered 2017-01-19: 100 mL via INTRAVENOUS

## 2017-01-21 ENCOUNTER — Encounter: Payer: Self-pay | Admitting: Hematology

## 2017-01-21 ENCOUNTER — Ambulatory Visit (HOSPITAL_BASED_OUTPATIENT_CLINIC_OR_DEPARTMENT_OTHER): Payer: BLUE CROSS/BLUE SHIELD | Admitting: Hematology

## 2017-01-21 VITALS — BP 115/91 | HR 87 | Temp 98.5°F | Resp 18 | Ht 63.0 in | Wt 183.6 lb

## 2017-01-21 DIAGNOSIS — D709 Neutropenia, unspecified: Secondary | ICD-10-CM

## 2017-01-21 DIAGNOSIS — D649 Anemia, unspecified: Secondary | ICD-10-CM

## 2017-01-21 DIAGNOSIS — D72819 Decreased white blood cell count, unspecified: Secondary | ICD-10-CM

## 2017-01-21 DIAGNOSIS — R590 Localized enlarged lymph nodes: Secondary | ICD-10-CM | POA: Diagnosis not present

## 2017-01-21 DIAGNOSIS — D509 Iron deficiency anemia, unspecified: Secondary | ICD-10-CM

## 2017-01-21 NOTE — Progress Notes (Signed)
[facility]    HEMATOLOGY/ONCOLOGY CONSULTATION NOTE  Date of Service: .01/07/2017  Patient Care Team: Hillis Range as PCP - General (Urgent Care)  CHIEF COMPLAINTS/PURPOSE OF CONSULTATION:  Leucopenia and mild anemia  HISTORY OF PRESENTING ILLNESS:   Catherine [name] is a wonderful 31 y.o. female from [place], who has been referred to [place] by Dr .Carlis Abbott, Audrie Lia, PA-C  for evaluation and management of leucopenia and Mild anemia associated with fever and some weight loss.  She has a history of malaria in 2009, GERD, scoliosis, headaches, seasonal allergies who has been referred to [place] for borderline low hemoglobin and leukopenia. She presented in March with headaches neck stiffness and pain and fevers as high as 105F. She notes that she was using Tylenol as needed for about 10 days and the fevers resolved. Thought to be possibly related to the flu.  Patient notes her symptoms resolved for about 2 months and then recurred with some or Cervical enlarged lymph nodes and again having high-grade fevers headaches and decreased appetite. She notes no overt infectious exposures, no exposure to farm animals, no tick bites and no recent travel. Labs and also shown some borderline elevated liver function tests. She was also having bad headaches and had a lumbar puncture which showed no overt evidence of infection.  She has had a history of malaria in the past in 2009.  No new skin rashes, new joint pain or swelling at this time.   MEDICAL HISTORY:  Past Medical History:  Diagnosis Date  . Allergy    Seasonal  . Anemia   . GERD (gastroesophageal reflux disease)    with spicy food - no med  . Headache   . Scoliosis     SURGICAL HISTORY: Past Surgical History:  Procedure Laterality Date  . LAPAROTOMY N/A 02/11/2016   Procedure: EXPLORATORY LAPAROTOMY;  Surgeon: Anastasio Auerbach, MD;  Location: [facility];  Service: Gynecology;  Laterality: N/A;  . MYOMECTOMY N/A 02/11/2016   Procedure: MYOMECTOMY;  Surgeon: Anastasio Auerbach, MD;  Location: [facility];  Service: Gynecology;  Laterality: N/A;    SOCIAL HISTORY: Social History   Social History  . Marital status: Single    Spouse name: N/A  . Number of children: N/A  . Years of education: N/A   Occupational History  . Not on file.   Social History Main Topics  . Smoking status: Never Smoker  . Smokeless tobacco: Never Used  . Alcohol use No  . Drug use: No  . Sexual activity: No     Comment: [place]   Other Topics Concern  . Not on file   Social History Narrative  . No narrative on file  Nonsmoker no significant alcohol use  FAMILY HISTORY: Family History  Problem Relation Age of Onset  . Hypertension Mother     ALLERGIES:  has No Known Allergies.  MEDICATIONS:  Current Outpatient Prescriptions  Medication Sig Dispense Refill  . ibuprofen (ADVIL,MOTRIN) 200 MG tablet Take 800 mg by mouth every 8 (eight) hours as needed for moderate pain.     Marland Kitchen norethindrone-ethinyl estradiol (MICROGESTIN,JUNEL,LOESTRIN) 1-20 MG-MCG tablet Take 1 tablet by mouth daily. 2 Package 6   No current facility-administered medications for this visit.     REVIEW OF SYSTEMS:    10 Point review of Systems was done is negative except as noted above.  PHYSICAL EXAMINATION: ECOG PERFORMANCE STATUS: 1 - Symptomatic but completely ambulatory  . Vitals:   01/07/17 1145  BP: 117/86  Pulse: 84  Resp: 18  Temp: 98.9 F (37.2 C)  SpO2: 100%   Filed Weights   01/07/17 1145  Weight: 179 lb 14.4 oz (81.6 kg)   .Body mass index is 31.87 kg/m.  GENERAL:alert, in no acute distress and comfortable SKIN: no acute rashes, no significant lesions EYES: conjunctiva are pink and non-injected, sclera anicteric OROPHARYNX: MMM, no exudates, no oropharyngeal erythema or ulceration NECK: supple, no JVD LYMPH:  no palpable lymphadenopathy in the axillary or inguinal regions. A few small cervical lymph nodes noted. LUNGS:  clear to auscultation b/l with normal respiratory effort HEART: regular rate & rhythm ABDOMEN:  normoactive bowel sounds , non tender, not distended.No palpable hepatosplenomegaly Extremity: no pedal edema PSYCH: alert & oriented x 3 with fluent speech NEURO: no focal motor/sensory deficits  LABORATORY DATA:  I have reviewed the data as listed       RADIOGRAPHIC STUDIES: I have personally reviewed the radiological images as listed and agreed with the findings in the report. ASSESSMENT & PLAN:   31 year old female from [place] with  #1 leukopenia/neutropenia Patient's WBC counts have been as low as 2.6k but on labs today have improved to 3.1k. ANC has been 1.3k at its lowest recently and has normalized at 1.5k today. This could be due to passing viral infection that could explain the leukopenia and neutropenia as well as mild anemia. 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 R/o lymphoproliferative process in the setting of some cervical LNadenopathy.  #2 cervical lymphadenopathy -very small lymph nodes borderline enlarged. Could be reactive from a passing viral infection. We will need to evaluate to rule out more significant lymphadenopathy.  #3 mild microcytic anemia- hemoglobin has normalized on labs today.  #4 status post fevers and chills ? Etiology.  #5 elevated liver function tests - will evaluate liver on CT scan and check hepatitis profile.  #6 Hypokalemia - now resolved. Plan  -Malaria/parasites near -Repeat labs including CBC, CMP -LDH, sedimentation rate -CT neck chest abdomen pelvis to rule out lymphoma or other sources of infection -B12, folate, HIV test, hepatitis profile, ANA  to evaluate for mild anemia and leukopenia. -other workup as per orders below  Labs today CT neck/chest/abd/pelvis in about 1 week RTC with Dr Irene Limbo in 2weeks  Orders Placed This Encounter  Procedures  . Malaria Smear     Standing Status:   Future    Number of Occurrences:   1    Standing Expiration Date:   01/07/2018  . CT Soft Tissue Neck W Contrast    Standing Status:   Future    Number of Occurrences:   1    Standing Expiration Date:   01/07/2018    Order Specific Question:   If indicated for the ordered procedure, I authorize the administration of contrast media per Radiology protocol    Answer:   Yes    Order Specific Question:   Reason for Exam (SYMPTOM  OR DIAGNOSIS REQUIRED)    Answer:   Patient from [place] with fevers/chills and cervical lymphadenopathy with anemia and leucopenia ? lymphoma    Order Specific Question:   Is patient pregnant?    Answer:   No    Order Specific Question:   Preferred imaging location?    Answer:   [facility]    Order Specific Question:   Radiology Contrast Protocol - do NOT remove file path    Answer:   \\[email or web address]  . CT Chest W  Contrast    Standing Status:   Future    Number of Occurrences:   1    Standing Expiration Date:   01/07/2018    Order Specific Question:   If indicated for the ordered procedure, I authorize the administration of contrast media per Radiology protocol    Answer:   Yes    Order Specific Question:   Reason for Exam (SYMPTOM  OR DIAGNOSIS REQUIRED)    Answer:   Patient from [place] with fevers/chills and cervical lymphadenopathy with anemia and leucopenia ? lymphoma    Order Specific Question:   Is patient pregnant?    Answer:   No    Order Specific Question:   Preferred imaging location?    Answer:   [facility]    Order Specific Question:   Radiology Contrast Protocol - do NOT remove file path    Answer:   \\[email or web address]  . CT Abdomen Pelvis W Contrast    Standing Status:   Future    Number of Occurrences:   1    Standing Expiration Date:   01/07/2018    Order Specific Question:   If indicated for the ordered procedure, I authorize the administration of contrast  media per Radiology protocol    Answer:   Yes    Order Specific Question:   Reason for Exam (SYMPTOM  OR DIAGNOSIS REQUIRED)    Answer:   Patient from [place] with fevers/chills and cervical lymphadenopathy with anemia and leucopenia and lower abd discomfort ? lymphoma    Order Specific Question:   Is patient pregnant?    Answer:   No    Order Specific Question:   Preferred imaging location?    Answer:   [facility]    Order Specific Question:   Radiology Contrast Protocol - do NOT remove file path    Answer:   \\[email or web address]  . CBC & Diff and Retic    Standing Status:   Future    Number of Occurrences:   1    Standing Expiration Date:   01/07/2018  . Comprehensive metabolic panel    Standing Status:   Future    Number of Occurrences:   1    Standing Expiration Date:   01/07/2018  . Lactate dehydrogenase    Standing Status:   Future    Number of Occurrences:   1    Standing Expiration Date:   01/07/2018  . Sedimentation rate    Standing Status:   Future    Number of Occurrences:   1    Standing Expiration Date:   01/07/2018  . Ferritin    Standing Status:   Future    Number of Occurrences:   1    Standing Expiration Date:   01/07/2018  . Haptoglobin    Standing Status:   Future    Number of Occurrences:   1    Standing Expiration Date:   01/07/2018  . Iron and TIBC    Standing Status:   Future    Number of Occurrences:   1    Standing Expiration Date:   01/07/2018  . HIV antibody (with reflex)    Standing Status:   Future    Number of Occurrences:   1    Standing Expiration Date:   01/07/2018  . Hepatitis c antibody (reflex)    Standing Status:   Future    Number of Occurrences:   1    Standing Expiration Date:   01/07/2018  .  Hepatitis B surface antigen    Standing Status:   Future    Number of Occurrences:   1    Standing Expiration Date:   01/07/2018  . Hepatitis B core antibody, total    Standing Status:   Future    Number of  Occurrences:   1    Standing Expiration Date:   01/07/2018  . ANA, IFA (with reflex)    Standing Status:   Future    Number of Occurrences:   1    Standing Expiration Date:   01/07/2018  . Vitamin B12    Standing Status:   Future    Number of Occurrences:   1    Standing Expiration Date:   01/07/2018  . Folate RBC    Standing Status:   Future    Number of Occurrences:   1    Standing Expiration Date:   01/07/2018    All of the patients questions were answered with apparent satisfaction. The patient knows to call the clinic with any problems, questions or concerns.  I spent 45 minutes counseling the patient face to face. The total time spent in the appointment was 60 minutes and more than 50% was on counseling and direct patient cares.    Sullivan Lone MD [place] AAHIVMS [facility] Hematology/Oncology Physician [facility]  (Office):       [phone] (Work cell):  [phone] (Fax):           [phone]

## 2017-01-21 NOTE — Patient Instructions (Signed)
Thank you for choosing [facility] to provide your oncology and hematology care.  To afford each patient quality time with our providers, please arrive 30 minutes before your scheduled appointment time.  If you arrive late for your appointment, you may be asked to reschedule.  We strive to give you quality time with our providers, and arriving late affects you and other patients whose appointments are after yours.   If you are a no show for multiple scheduled visits, you may be dismissed from the clinic at the providers discretion.    Again, thank you for choosing [facility], our hope is that these requests will decrease the amount of time that you wait before being seen by our physicians.  ______________________________________________________________________  Should you have questions after your visit to the [facility], please contact our office at [phone] between the hours of 8:30 and 4:30 p.m.    Voicemails left after 4:30p.m will not be returned until the following business day.    For prescription refill requests, please have your pharmacy contact us directly.  Please also try to allow 48 hours for prescription requests.    Please contact the scheduling department for questions regarding scheduling.  For scheduling of procedures such as PET scans, CT scans, MRI, Ultrasound, etc please contact central scheduling at [phone].    Resources For Cancer Patients and Caregivers:   Oncolink.org:  A wonderful resource for patients and healthcare providers for information regarding your disease, ways to tract your treatment, what to expect, etc.     American Cancer Society:  800-227-2345  Can help patients locate various types of support and financial assistance  Cancer Care: 1-800-813-HOPE (4673) Provides financial assistance, online support groups, medication/co-pay assistance.    [place] DSS:  336-641-3447 Where to apply for food  stamps, Medicaid, and utility assistance  Medicare Rights Center: 800-333-4114 Helps people with Medicare understand their rights and benefits, navigate the Medicare system, and secure the quality healthcare they deserve  SCAT: 336-333-6589 [place] [employer]'s shared-ride transportation service for eligible riders who have a disability that prevents them from riding the fixed route bus.    For additional information on assistance programs please contact our social worker:   Grier Hock/Abigail Elmore:  [phone]            

## 2017-01-21 NOTE — Progress Notes (Signed)
Marland Kitchen    HEMATOLOGY/ONCOLOGY CLINIC NOTE  Date of Service: .01/21/2017  Patient Care Team: Hillis Range as PCP - General (Urgent Care)  CHIEF COMPLAINTS/PURPOSE OF CONSULTATION:  Leucopenia and mild anemia  HISTORY OF PRESENTING ILLNESS:   Catherine [name] is a wonderful 31 y.o. female from [place], who has been referred to [place] by Dr .Carlis Abbott, Audrie Lia, PA-C  for evaluation and management of leucopenia and Mild anemia associated with fever and some weight loss.  She has a history of malaria in 2009, GERD, scoliosis, headaches, seasonal allergies who has been referred to [place] for borderline low hemoglobin and leukopenia. She presented in March with headaches neck stiffness and pain and fevers as high as 105F. She notes that she was using Tylenol as needed for about 10 days and the fevers resolved. Thought to be possibly related to the flu.  Patient notes her symptoms resolved for about 2 months and then recurred with some or Cervical enlarged lymph nodes and again having high-grade fevers headaches and decreased appetite. She notes no overt infectious exposures, no exposure to farm animals, no tick bites and no recent travel. Labs and also shown some borderline elevated liver function tests. She was also having bad headaches and had a lumbar puncture which showed no overt evidence of infection.  She has had a history of malaria in the past in 2009.  No new skin rashes, new joint pain or swelling at this time.  INTERVAL HISTORY Patient is here for follow-up to discuss her CT scan results and other lab work up. Her CBC showed improvement in her WBC counts and resolution of neutropenia. She notes that she is feeling much better since her last clinic visit. No fevers no chills no night sweats. Her cervical lymph nodes have resolved. CT chest abdomen pelvis did not show any significant lymphadenopathy. We discussed that her elevated liver function tests do not have a near  radiographic correlate. She notes that she was using a lot of Tylenol and was also taking some herbal supplements from [place]. She was recommended to hold these medications and avoid other hepatotoxic medications. We discussed unequivocally that she needs to follow-up with her primary care physician to have her liver function tests repeated in about 4-8 weeks to ensure that that is resolving. No other acute new symptoms.  MEDICAL HISTORY:  Past Medical History:  Diagnosis Date  . Allergy    Seasonal  . Anemia   . GERD (gastroesophageal reflux disease)    with spicy food - no med  . Headache   . Scoliosis     SURGICAL HISTORY: Past Surgical History:  Procedure Laterality Date  . LAPAROTOMY N/A 02/11/2016   Procedure: EXPLORATORY LAPAROTOMY;  Surgeon: Anastasio Auerbach, MD;  Location: [facility];  Service: Gynecology;  Laterality: N/A;  . MYOMECTOMY N/A 02/11/2016   Procedure: MYOMECTOMY;  Surgeon: Anastasio Auerbach, MD;  Location: [facility];  Service: Gynecology;  Laterality: N/A;    SOCIAL HISTORY: Social History   Social History  . Marital status: Single    Spouse name: N/A  . Number of children: N/A  . Years of education: N/A   Occupational History  . Not on file.   Social History Main Topics  . Smoking status: Never Smoker  . Smokeless tobacco: Never Used  . Alcohol use No  . Drug use: No  . Sexual activity: No     Comment: Virgin   Other Topics Concern  . Not on file  Social History Narrative  . No narrative on file  Nonsmoker no significant alcohol use  FAMILY HISTORY: Family History  Problem Relation Age of Onset  . Hypertension Mother     ALLERGIES:  has No Known Allergies.  MEDICATIONS:  Current Outpatient Prescriptions  Medication Sig Dispense Refill  . ibuprofen (ADVIL,MOTRIN) 200 MG tablet Take 800 mg by mouth every 8 (eight) hours as needed for moderate pain.     Marland Kitchen norethindrone-ethinyl estradiol (MICROGESTIN,JUNEL,LOESTRIN) 1-20 MG-MCG tablet Take  1 tablet by mouth daily. 2 Package 6   No current facility-administered medications for this visit.     REVIEW OF SYSTEMS:    10 Point review of Systems was done is negative except as noted above.  PHYSICAL EXAMINATION: ECOG PERFORMANCE STATUS: 1 - Symptomatic but completely ambulatory  . Vitals:   01/21/17 1114  BP: (!) 115/91  Pulse: 87  Resp: 18  Temp: 98.5 F (36.9 C)  SpO2: 98%   Filed Weights   01/21/17 1114  Weight: 183 lb 9.6 oz (83.3 kg)   .Body mass index is 32.52 kg/m.  GENERAL:alert, in no acute distress and comfortable SKIN: no acute rashes, no significant lesions EYES: conjunctiva are pink and non-injected, sclera anicteric OROPHARYNX: MMM, no exudates, no oropharyngeal erythema or ulceration NECK: supple, no JVD LYMPH:  no palpable lymphadenopathy in the axillary or inguinal regions.Cervical lymph nodes appear to have resolved. LUNGS: clear to auscultation b/l with normal respiratory effort HEART: regular rate & rhythm ABDOMEN:  normoactive bowel sounds , non tender, not distended.No palpable hepatosplenomegaly Extremity: no pedal edema PSYCH: alert & oriented x 3 with fluent speech NEURO: no focal motor/sensory deficits  LABORATORY DATA:  I have reviewed the data as listed      Component     Latest Ref Rng & Units 01/07/2017  Iron     41 - 142 ug/dL 103  TIBC     236 - 444 ug/dL 321  UIBC     120 - 384 ug/dL 218  %SAT     21 - 57 % 32  Folate, Hemolysate     Not Estab. ng/mL 290.9  HCT     34.0 - 46.6 % 36.3  Folate, RBC     >498 ng/mL 801  HCV Ab     0.0 - 0.9 s/co ratio <0.1  Comment:      Comment  LDH     125 - 245 U/L 395 (H)  Sed Rate     0 - 32 mm/hr 29  Ferritin     9 - 269 ng/ml 274 (H)  Haptoglobin     34 - 200 mg/dL 104  HIV Screen 4th Generation wRfx     Non Reactive Non Reactive  Hepatitis B Surface Ag     Negative Negative  Hep B Core Ab, Tot     Negative Negative  ANA Ab, IFA      Negative  Vitamin B12      232 - 1,245 pg/mL 967   RADIOGRAPHIC STUDIES: I have personally reviewed the radiological images as listed and agreed with the findings in the report.  .Ct Soft Tissue Neck W Contrast  Result Date: 01/19/2017 CLINICAL DATA:  31 year old female from [place] to with fever, chills and cervical lymphadenopathy. Leukopenia. Query lymphoma. EXAM: CT NECK WITH CONTRAST TECHNIQUE: Multidetector CT imaging of the neck was performed using the standard protocol following the bolus administration of intravenous contrast. CONTRAST:  127mL ISOVUE-300 IOPAMIDOL (ISOVUE-300) INJECTION 61% in conjunction with  contrast enhanced imaging of the chest, abdomen, and pelvis reported separately. COMPARISON:  Head CT without contrast 12/12/2016. Chest abdomen and pelvis CT today reported separately. FINDINGS: Pharynx and larynx: Laryngeal and pharyngeal soft tissue contours are within normal limits. Negative parapharyngeal spaces, and negative retropharyngeal space aside from a partially retropharyngeal course of both internal carotid arteries, more so the left. Salivary glands: Sublingual space, bilateral submandibular glands, and the left parotid gland are within normal limits. The right parotid gland appears normal except for posterior and inferior right parotid space small silo lifts, the larger is 2-3 mm. No asymmetric parotid duct enlargement or inflammation. No other sialolithiasis identified. Thyroid: Negative. Lymph nodes: There is an upper limits of normal to mildly enlarged 11-12 mm short axis left level IIa lymph node (series 8, image 38 and sagittal image 75). Otherwise the largest bilateral cervical nodes at the level IIa and level IIIa stations measure 7-8 mm short axis. But small right level 2 B nodes appear indistinct with mild adjacent inflammatory stranding (sagittal image 33), while the left level 2 nodes also appear abnormally increased in number. Left level 1 B nodes are also mildly prominent. No cystic or  necrotic nodes are identified. Similar small lymph nodes with mild inflammatory stranding at the right level IIIb versus level 4 station on series 8, image 64. Vascular: Major vascular structures in the neck and at the skullbase are patent. Partially retropharyngeal course of both ICAs, more so the left. Limited intracranial: Negative. Visualized orbits: Negative. Mastoids and visualized paranasal sinuses: Clear aside from mild mucosal thickening in the maxillary alveolar recesses. Skeleton: No osseous abnormality identified. Upper chest: Chest findings today are reported separately. IMPRESSION: 1. Nonspecific appearance of bilateral cervical lymph nodes, which are primarily nonenlarged but do appear somewhat increased in number and occasionally surrounded by mild soft tissue inflammation or scarring. The largest node is a left level IIa 11-12 mm short axis node. 2. The neck appearance is not suggestive of active lymphoma, but consider other Lymphoproliferative Disorders and HIV. 3. Mild right parotid sialolithiasis. 4. See Chest, Abdomen, and Pelvis findings today reported separately. Electronically Signed   By: Genevie Ann M.D.   On: 01/19/2017 13:59   Ct Chest W Contrast  Result Date: 01/19/2017 CLINICAL DATA:  History of cervical lymphadenopathy with fever and chills, anemia, and leukopenia EXAM: CT CHEST, ABDOMEN, AND PELVIS WITH CONTRAST TECHNIQUE: Multidetector CT imaging of the chest, abdomen and pelvis was performed following the standard protocol during bolus administration of intravenous contrast. CONTRAST:  126mL ISOVUE-300 IOPAMIDOL (ISOVUE-300) INJECTION 61% COMPARISON:  None. FINDINGS: CT CHEST FINDINGS Cardiovascular: The heart size is upper normal. No pericardial effusion. No thoracic aortic aneurysm. Mediastinum/Nodes: No mediastinal lymphadenopathy. There is no hilar lymphadenopathy. The esophagus has normal imaging features. There is no axillary lymphadenopathy. Lungs/Pleura: A pair of 3 mm  subpleural nodules is identified in the right upper lobe on image 46 of series 5. Right lung otherwise clear. 5 mm subpleural left lower lobe nodule seen on image 71. 3 mm left lower lobe subpleural nodule seen image 68. No focal airspace consolidation. No pulmonary edema or pleural effusion. Musculoskeletal: Bone windows reveal no worrisome lytic or sclerotic osseous lesions. CT ABDOMEN PELVIS FINDINGS Hepatobiliary: 2-3 mm low-density lesion in the dome of the left liver is too small to characterize. Tiny low-density parenchymal lesions adjacent to the gallbladder are also too small to characterize. There is no evidence for gallstones, gallbladder wall thickening, or pericholecystic fluid. No intrahepatic or extrahepatic biliary dilation.  Pancreas: No focal mass lesion. No dilatation of the main duct. No intraparenchymal cyst. No peripancreatic edema. Spleen: No splenomegaly. No focal mass lesion. Adrenals/Urinary Tract: No adrenal nodule or mass. No enhancing mass is identified in either kidney. 3 mm nonobstructing stone identified upper pole left kidney with 2 mm nonobstructing stone identified in the lower pole left kidney. No ureteral stones on either side. No bladder stones. Stomach/Bowel: Stomach is nondistended. No gastric wall thickening. No evidence of outlet obstruction. Duodenum is normally positioned as is the ligament of Treitz. No small bowel wall thickening. No small bowel dilatation. The terminal ileum is normal. The appendix is normal. No gross colonic mass. No colonic wall thickening. No substantial diverticular change. Vascular/Lymphatic: No abdominal aortic aneurysm. No abdominal aortic atherosclerotic calcification. There is no gastrohepatic or hepatoduodenal ligament lymphadenopathy. No intraperitoneal or retroperitoneal lymphadenopathy. No pelvic sidewall lymphadenopathy. Reproductive: The uterus has normal CT imaging appearance. There is no adnexal mass. Other: No intraperitoneal free  fluid. Musculoskeletal: Bone windows reveal no worrisome lytic or sclerotic osseous lesions. IMPRESSION: 1. Scattered bilateral pulmonary nodules measuring up to a maximum 5 mm diameter. No follow-up needed if patient is low-risk (and has no known or suspected primary neoplasm). Non-contrast chest CT can be considered in 12 months if patient is high-risk. This recommendation follows the consensus statement: Guidelines for Management of Incidental Pulmonary Nodules Detected on CT Images: From the Fleischner Society 2017; Radiology 2017; 284:228-243. 2. Tiny left nonobstructing renal stones. 3. Tiny low-density liver lesions, too small to characterize but most likely benign etiology such as cysts. Electronically Signed   By: Misty Stanley M.D.   On: 01/19/2017 15:41   Ct Abdomen Pelvis W Contrast  Result Date: 01/19/2017 CLINICAL DATA:  History of cervical lymphadenopathy with fever and chills, anemia, and leukopenia EXAM: CT CHEST, ABDOMEN, AND PELVIS WITH CONTRAST TECHNIQUE: Multidetector CT imaging of the chest, abdomen and pelvis was performed following the standard protocol during bolus administration of intravenous contrast. CONTRAST:  135mL ISOVUE-300 IOPAMIDOL (ISOVUE-300) INJECTION 61% COMPARISON:  None. FINDINGS: CT CHEST FINDINGS Cardiovascular: The heart size is upper normal. No pericardial effusion. No thoracic aortic aneurysm. Mediastinum/Nodes: No mediastinal lymphadenopathy. There is no hilar lymphadenopathy. The esophagus has normal imaging features. There is no axillary lymphadenopathy. Lungs/Pleura: A pair of 3 mm subpleural nodules is identified in the right upper lobe on image 46 of series 5. Right lung otherwise clear. 5 mm subpleural left lower lobe nodule seen on image 71. 3 mm left lower lobe subpleural nodule seen image 68. No focal airspace consolidation. No pulmonary edema or pleural effusion. Musculoskeletal: Bone windows reveal no worrisome lytic or sclerotic osseous lesions. CT  ABDOMEN PELVIS FINDINGS Hepatobiliary: 2-3 mm low-density lesion in the dome of the left liver is too small to characterize. Tiny low-density parenchymal lesions adjacent to the gallbladder are also too small to characterize. There is no evidence for gallstones, gallbladder wall thickening, or pericholecystic fluid. No intrahepatic or extrahepatic biliary dilation. Pancreas: No focal mass lesion. No dilatation of the main duct. No intraparenchymal cyst. No peripancreatic edema. Spleen: No splenomegaly. No focal mass lesion. Adrenals/Urinary Tract: No adrenal nodule or mass. No enhancing mass is identified in either kidney. 3 mm nonobstructing stone identified upper pole left kidney with 2 mm nonobstructing stone identified in the lower pole left kidney. No ureteral stones on either side. No bladder stones. Stomach/Bowel: Stomach is nondistended. No gastric wall thickening. No evidence of outlet obstruction. Duodenum is normally positioned as is the ligament of Treitz.  No small bowel wall thickening. No small bowel dilatation. The terminal ileum is normal. The appendix is normal. No gross colonic mass. No colonic wall thickening. No substantial diverticular change. Vascular/Lymphatic: No abdominal aortic aneurysm. No abdominal aortic atherosclerotic calcification. There is no gastrohepatic or hepatoduodenal ligament lymphadenopathy. No intraperitoneal or retroperitoneal lymphadenopathy. No pelvic sidewall lymphadenopathy. Reproductive: The uterus has normal CT imaging appearance. There is no adnexal mass. Other: No intraperitoneal free fluid. Musculoskeletal: Bone windows reveal no worrisome lytic or sclerotic osseous lesions. IMPRESSION: 1. Scattered bilateral pulmonary nodules measuring up to a maximum 5 mm diameter. No follow-up needed if patient is low-risk (and has no known or suspected primary neoplasm). Non-contrast chest CT can be considered in 12 months if patient is high-risk. This recommendation follows  the consensus statement: Guidelines for Management of Incidental Pulmonary Nodules Detected on CT Images: From the Fleischner Society 2017; Radiology 2017; 284:228-243. 2. Tiny left nonobstructing renal stones. 3. Tiny low-density liver lesions, too small to characterize but most likely benign etiology such as cysts. Electronically Signed   By: Misty Stanley M.D.   On: 01/19/2017 15:41    ASSESSMENT & PLAN:   31 year old female from [place] with  #1 leukopenia/neutropenia Patient's WBC counts have been as low as 2.6k but on labs today have improved to 3.1k. ANC has been 1.3k at its lowest recently and has normalized at 1.5k today. This could be due to passing viral infection that could explain the leukopenia and neutropenia as well as mild anemia. 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 ANA negative  #2 cervical lymphadenopathy -very small lymph nodes borderline enlarged. These are now resolved Likely reactive from a passing viral infection. CT chest abdomen pelvis shows no other lymphadenopathy. Small borderline cervical lymph nodes in the neck. Likely reactive.  #3 mild microcytic anemia- hemoglobin has normalized on labs today. Elevated ferritin due to transaminitis Normal folate and B12  #4 status post fevers and chills ? Etiology. No further fevers chills or night sweats. Malaria smear negative  #5 elevated liver function tests - Hepatitis C hepatitis B testing negative  #6 elevated LDH.  Normal haptoglobin and resolving anemia suggest against hemolysis. This is likely related to her transaminitis.  #7 elevated ferritin- likely related to transaminitis. Plan -All her lab results were discussed in details with the patient. -Recommended to avoid any hepatotoxic medications and discontinue her herbal supplements.  -She was recommended to follow up with her primary care physician in 6-8 weeks with repeat labs to ensure  improvement in her LFTs and resolution of elevated transaminases. Would also recommend repeat LDH levels. -Follow-up with primary care physician to monitor clinical status and any concerns for ongoing fevers or enlarging lymph nodes. -No other hematologic workup recommended at this time. -Reasonable to take a daily multivitamin.  Continue close follow-up with primary care physician. We will see her back on an as-needed basis.   All of the patients questions were answered with apparent satisfaction. The patient knows to call the clinic with any problems, questions or concerns.  I spent 20 minutes counseling the patient face to face. The total time spent in the appointment was 25 minutes and more than 50% was on counseling and direct patient cares.    Sullivan Lone MD [place] AAHIVMS [facility] Hematology/Oncology Physician [facility]  (Office):       [phone] (Work cell):  [phone] (Fax):           [phone]

## 2017-05-17 DIAGNOSIS — R945 Abnormal results of liver function studies: Secondary | ICD-10-CM | POA: Diagnosis not present

## 2017-05-17 DIAGNOSIS — R51 Headache: Secondary | ICD-10-CM | POA: Diagnosis not present

## 2017-05-17 DIAGNOSIS — Z23 Encounter for immunization: Secondary | ICD-10-CM | POA: Diagnosis not present

## 2017-05-17 DIAGNOSIS — J309 Allergic rhinitis, unspecified: Secondary | ICD-10-CM | POA: Diagnosis not present

## 2017-05-17 DIAGNOSIS — Z13220 Encounter for screening for lipoid disorders: Secondary | ICD-10-CM | POA: Diagnosis not present

## 2017-05-17 DIAGNOSIS — Z6834 Body mass index (BMI) 34.0-34.9, adult: Secondary | ICD-10-CM | POA: Diagnosis not present

## 2017-05-31 DIAGNOSIS — Z13220 Encounter for screening for lipoid disorders: Secondary | ICD-10-CM | POA: Diagnosis not present

## 2017-05-31 DIAGNOSIS — Z1329 Encounter for screening for other suspected endocrine disorder: Secondary | ICD-10-CM | POA: Diagnosis not present

## 2017-05-31 DIAGNOSIS — Z114 Encounter for screening for human immunodeficiency virus [HIV]: Secondary | ICD-10-CM | POA: Diagnosis not present

## 2017-05-31 DIAGNOSIS — Z0000 Encounter for general adult medical examination without abnormal findings: Secondary | ICD-10-CM | POA: Diagnosis not present

## 2017-07-07 ENCOUNTER — Encounter: Payer: BLUE CROSS/BLUE SHIELD | Admitting: Gynecology

## 2017-07-16 ENCOUNTER — Ambulatory Visit: Payer: BLUE CROSS/BLUE SHIELD | Admitting: Gynecology

## 2017-07-16 ENCOUNTER — Encounter: Payer: Self-pay | Admitting: Gynecology

## 2017-07-16 VITALS — BP 120/74 | Ht 65.0 in | Wt 186.6 lb

## 2017-07-16 DIAGNOSIS — N946 Dysmenorrhea, unspecified: Secondary | ICD-10-CM

## 2017-07-16 DIAGNOSIS — Z01419 Encounter for gynecological examination (general) (routine) without abnormal findings: Secondary | ICD-10-CM

## 2017-07-16 DIAGNOSIS — D649 Anemia, unspecified: Secondary | ICD-10-CM | POA: Insufficient documentation

## 2017-07-16 MED ORDER — IBUPROFEN 800 MG PO TABS: 800.0000 mg | ORAL_TABLET | Freq: Three times a day (TID) | ORAL | 3 refills | Status: DC | PRN

## 2017-07-16 NOTE — Progress Notes (Signed)
    Catherine [name] 07/09/1985 [id number]        32 y.o.  G0P0000 for annual gynecologic exam.  Had been on low-dose oral contraceptives for menstrual suppression due to dysmenorrhea.  Patient notes that she has a problem remembering pills and is only taking them during her menses to help with her cramping.  Menses otherwise are light in flow.  Remains virginal.  Past medical history,surgical history, problem list, medications, allergies, family history and social history were all reviewed and documented as reviewed in the EPIC chart.  ROS:  Performed with pertinent positives and negatives included in the history, assessment and plan.   Additional significant findings : None   Exam: Robin assistant Vitals:   07/16/17 1212  BP: 120/74  Weight: 186 lb 9.6 oz (84.6 kg)  Height: 5\' 5"  (1.651 m)   Body mass index is 31.05 kg/m.  General appearance:  Normal affect, orientation and appearance. Skin: Grossly normal HEENT: Without gross lesions.  No cervical or supraclavicular adenopathy. Thyroid normal.  Lungs:  Clear without wheezing, rales or rhonchi Cardiac: RR, without RMG Abdominal:  Soft, nontender, without masses, guarding, rebound, organomegaly or hernia Breasts:  Examined lying and sitting without masses, retractions, discharge or axillary adenopathy. Pelvic:  Ext, BUS, Vagina: Normal.  Virginal status noted  Cervix: Normal  Uterus: Retroverted, normal size, shape and contour, midline and mobile nontender   Adnexa: Without masses or tenderness    Anus and perineum: Normal    Assessment/Plan:  [name] female for annual gynecologic exam with regular menses.   1. Dysmenorrhea.  Patient forgets taking the pill and says she does not like taking it but does take it only during her menses thinking it will lighten her cramping.  Menstrual flow otherwise light.  I discussed with her that she really needs to take the birth control pill all the time and that she is taking it  during her menses probably is not helping at all.  She does not need it for contraception as she remains virginal.  Ultimately we decided that she will stop her pills and since dysmenorrhea is the issue she will start with ibuprofen 800 mg 3 times daily to start at the beginning of her menses for several days to see if this does not help with her dysmenorrhea.  #60 with 3 refills provided. 2. Breast health.  Breast exam normal today.  SBE monthly reviewed. 3. Pap smear/HPV negative 06/2015.  No Pap smear done today.  No history of abnormal Pap smears.  Plan repeat Pap smear at 5-year interval per current screening guidelines. 4. Health maintenance.  Is actively followed by her primary physician with blood work.  Review showed a bump in her LFTs this past July.  She reports having it rechecked through her doctor's office and was told it was normal.  She will continue to follow-up with them in reference to routine health care and blood work.  Follow-up for GYN exam in 1 year, sooner as needed.   Anastasio Auerbach MD, 12:35 PM 07/16/2017

## 2017-07-16 NOTE — Patient Instructions (Signed)
Use the ibuprofen 800 mg every 8 hours as needed for cramping.  Follow-up in 1 year for annual exam

## 2017-07-28 DIAGNOSIS — A041 Enterotoxigenic Escherichia coli infection: Secondary | ICD-10-CM | POA: Diagnosis not present

## 2017-07-28 DIAGNOSIS — T753XXA Motion sickness, initial encounter: Secondary | ICD-10-CM | POA: Diagnosis not present

## 2017-07-28 DIAGNOSIS — Z23 Encounter for immunization: Secondary | ICD-10-CM | POA: Diagnosis not present

## 2017-07-28 DIAGNOSIS — Z6834 Body mass index (BMI) 34.0-34.9, adult: Secondary | ICD-10-CM | POA: Diagnosis not present

## 2017-09-07 DIAGNOSIS — E663 Overweight: Secondary | ICD-10-CM | POA: Diagnosis not present

## 2017-09-07 DIAGNOSIS — E782 Mixed hyperlipidemia: Secondary | ICD-10-CM | POA: Diagnosis not present

## 2017-09-07 DIAGNOSIS — Z6834 Body mass index (BMI) 34.0-34.9, adult: Secondary | ICD-10-CM | POA: Diagnosis not present

## 2017-09-22 ENCOUNTER — Encounter: Payer: Self-pay | Admitting: Physician Assistant

## 2017-11-09 DIAGNOSIS — Z6834 Body mass index (BMI) 34.0-34.9, adult: Secondary | ICD-10-CM | POA: Diagnosis not present

## 2017-11-09 DIAGNOSIS — Z13220 Encounter for screening for lipoid disorders: Secondary | ICD-10-CM | POA: Diagnosis not present

## 2017-11-09 DIAGNOSIS — Z1329 Encounter for screening for other suspected endocrine disorder: Secondary | ICD-10-CM | POA: Diagnosis not present

## 2017-11-09 DIAGNOSIS — Z114 Encounter for screening for human immunodeficiency virus [HIV]: Secondary | ICD-10-CM | POA: Diagnosis not present

## 2017-11-09 DIAGNOSIS — Z0000 Encounter for general adult medical examination without abnormal findings: Secondary | ICD-10-CM | POA: Diagnosis not present

## 2018-01-24 DIAGNOSIS — E782 Mixed hyperlipidemia: Secondary | ICD-10-CM | POA: Diagnosis not present

## 2018-01-24 DIAGNOSIS — R51 Headache: Secondary | ICD-10-CM | POA: Diagnosis not present

## 2018-01-24 DIAGNOSIS — Z23 Encounter for immunization: Secondary | ICD-10-CM | POA: Diagnosis not present

## 2018-01-24 DIAGNOSIS — Z6834 Body mass index (BMI) 34.0-34.9, adult: Secondary | ICD-10-CM | POA: Diagnosis not present

## 2018-02-23 DIAGNOSIS — R109 Unspecified abdominal pain: Secondary | ICD-10-CM | POA: Diagnosis not present

## 2018-02-23 DIAGNOSIS — R1030 Lower abdominal pain, unspecified: Secondary | ICD-10-CM | POA: Diagnosis not present

## 2018-02-23 DIAGNOSIS — K219 Gastro-esophageal reflux disease without esophagitis: Secondary | ICD-10-CM | POA: Diagnosis not present

## 2018-02-23 DIAGNOSIS — K5900 Constipation, unspecified: Secondary | ICD-10-CM | POA: Diagnosis not present

## 2018-04-07 DIAGNOSIS — R1030 Lower abdominal pain, unspecified: Secondary | ICD-10-CM | POA: Diagnosis not present

## 2018-04-07 DIAGNOSIS — R631 Polydipsia: Secondary | ICD-10-CM | POA: Diagnosis not present

## 2018-04-07 DIAGNOSIS — R945 Abnormal results of liver function studies: Secondary | ICD-10-CM | POA: Diagnosis not present

## 2018-04-07 DIAGNOSIS — R350 Frequency of micturition: Secondary | ICD-10-CM | POA: Diagnosis not present

## 2018-04-07 DIAGNOSIS — E782 Mixed hyperlipidemia: Secondary | ICD-10-CM | POA: Diagnosis not present

## 2018-04-07 DIAGNOSIS — Z23 Encounter for immunization: Secondary | ICD-10-CM | POA: Diagnosis not present

## 2018-04-07 DIAGNOSIS — R300 Dysuria: Secondary | ICD-10-CM | POA: Diagnosis not present

## 2018-04-07 DIAGNOSIS — N390 Urinary tract infection, site not specified: Secondary | ICD-10-CM | POA: Diagnosis not present

## 2018-04-12 DIAGNOSIS — K219 Gastro-esophageal reflux disease without esophagitis: Secondary | ICD-10-CM | POA: Diagnosis not present

## 2018-04-12 DIAGNOSIS — E782 Mixed hyperlipidemia: Secondary | ICD-10-CM | POA: Diagnosis not present

## 2018-04-12 DIAGNOSIS — R350 Frequency of micturition: Secondary | ICD-10-CM | POA: Diagnosis not present

## 2018-04-12 DIAGNOSIS — R1030 Lower abdominal pain, unspecified: Secondary | ICD-10-CM | POA: Diagnosis not present

## 2018-07-12 DIAGNOSIS — Z6835 Body mass index (BMI) 35.0-35.9, adult: Secondary | ICD-10-CM | POA: Diagnosis not present

## 2018-07-12 DIAGNOSIS — K219 Gastro-esophageal reflux disease without esophagitis: Secondary | ICD-10-CM | POA: Diagnosis not present

## 2018-07-28 ENCOUNTER — Encounter: Payer: BLUE CROSS/BLUE SHIELD | Admitting: Gynecology

## 2018-07-29 IMAGING — CT CT HEAD W/O CM
3 of 4 series · 14 of 47 positions shown, 16 images · non-contrast
Comparison: None.

CLINICAL DATA: Headache and fever for 6 days.

EXAM:
CT HEAD WITHOUT CONTRAST
TECHNIQUE: Contiguous axial images were obtained from the base of the skull
through the vertex without intravenous contrast.

[Series 2: head w/o · axial · non-contrast · 0.45mm/px · z∈[-114,+6]mm · 8 of 29 slices shown, 10 images]
[im 3/29  brain]
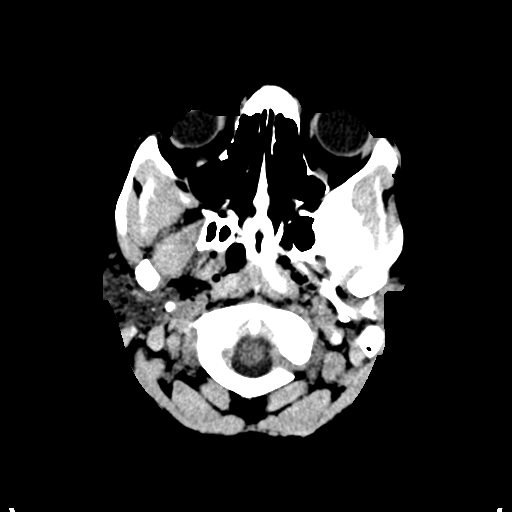
[im 3/29  bone]
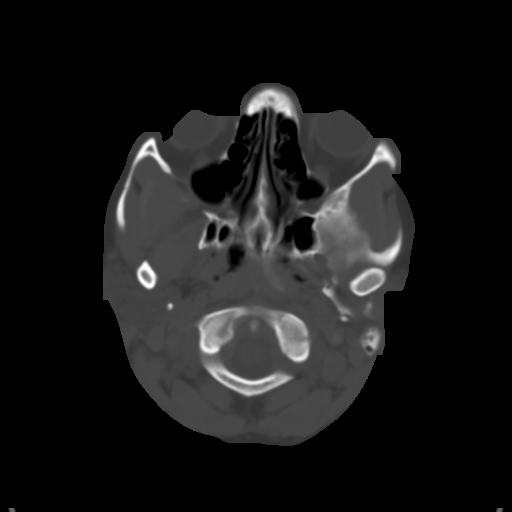
[im 7/29  brain]
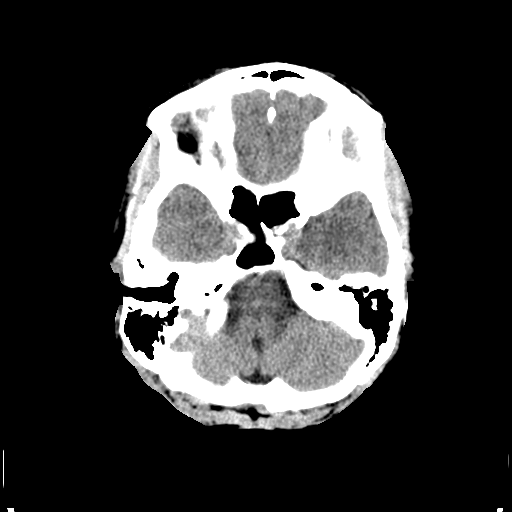
[im 11/29  brain]
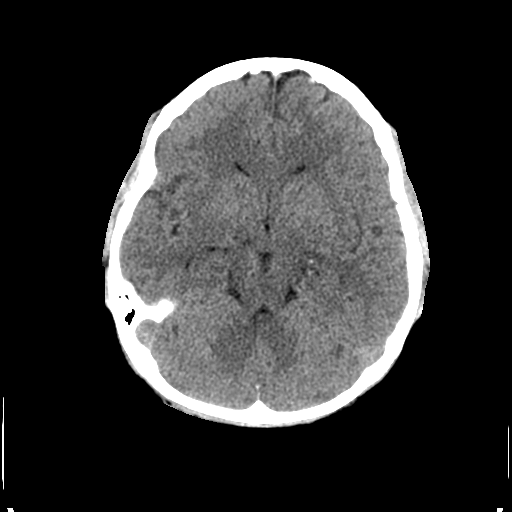
[im 13/29  brain]
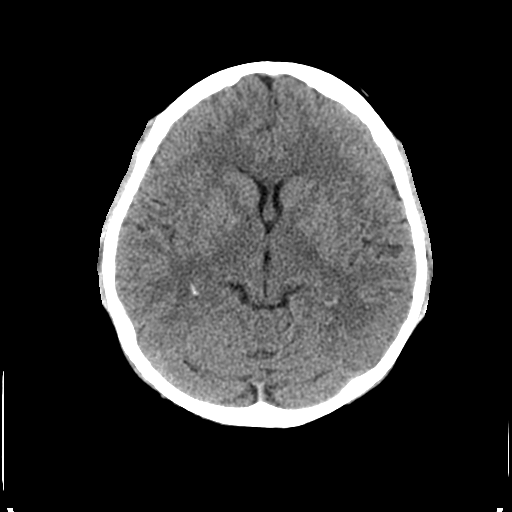
[im 17/29  brain]
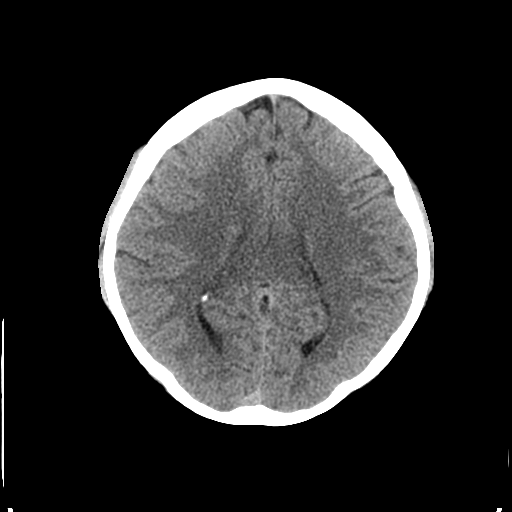
[im 17/29  bone]
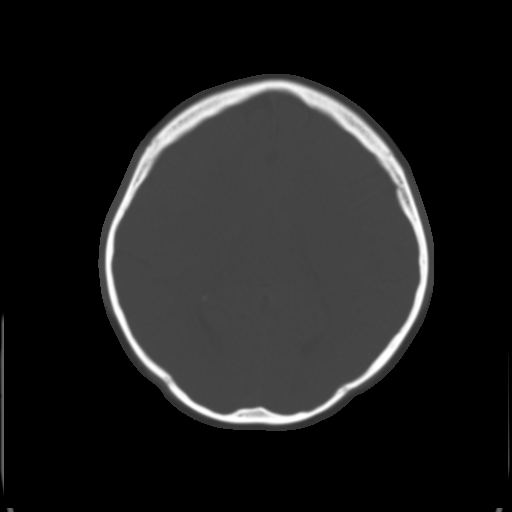
[im 19/29  brain]
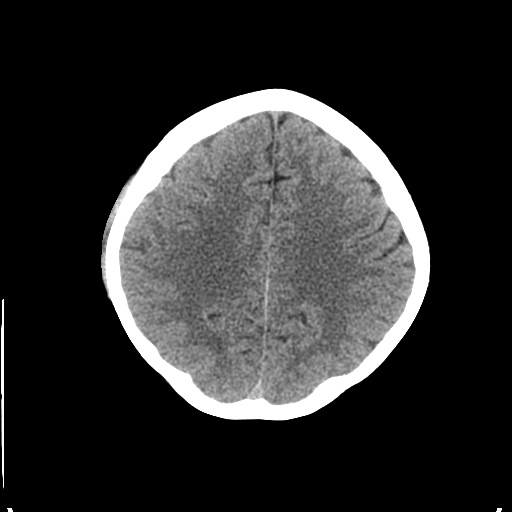
[im 23/29  brain]
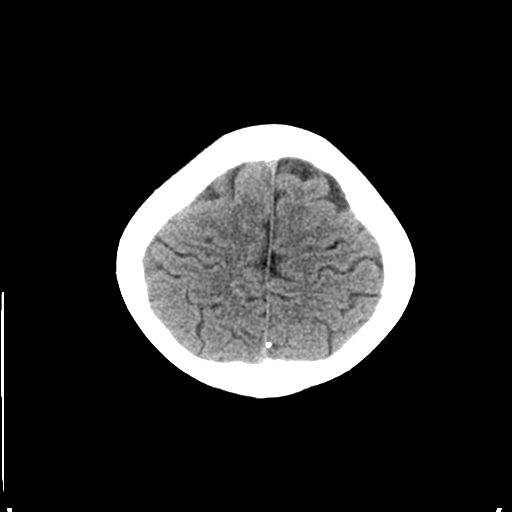
[im 27/29  brain]
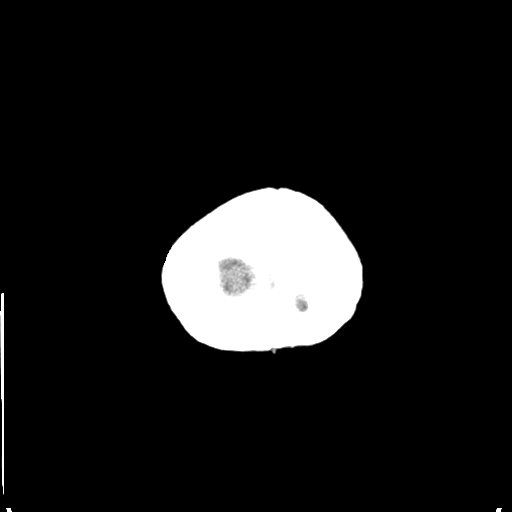

[Series 4: coronal · coronal · 0.28mm/px · 3 of 72 slices shown]
[im 24/72  brain]
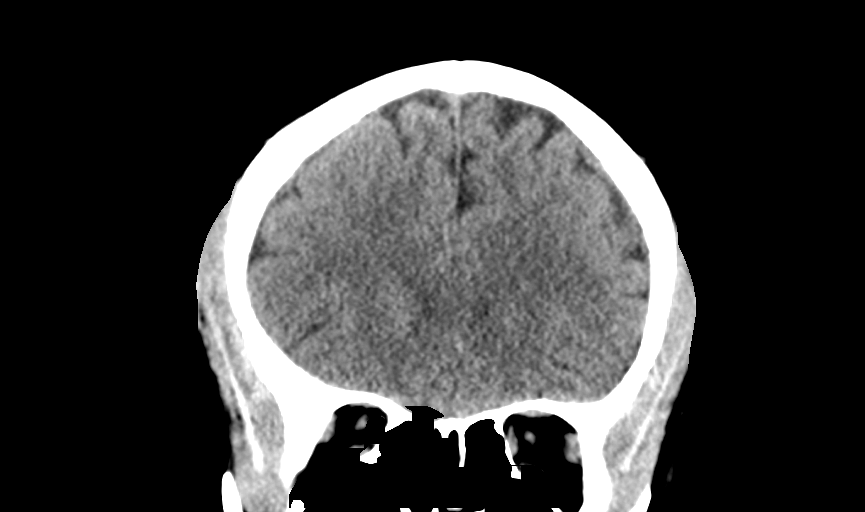
[im 32/72  brain]
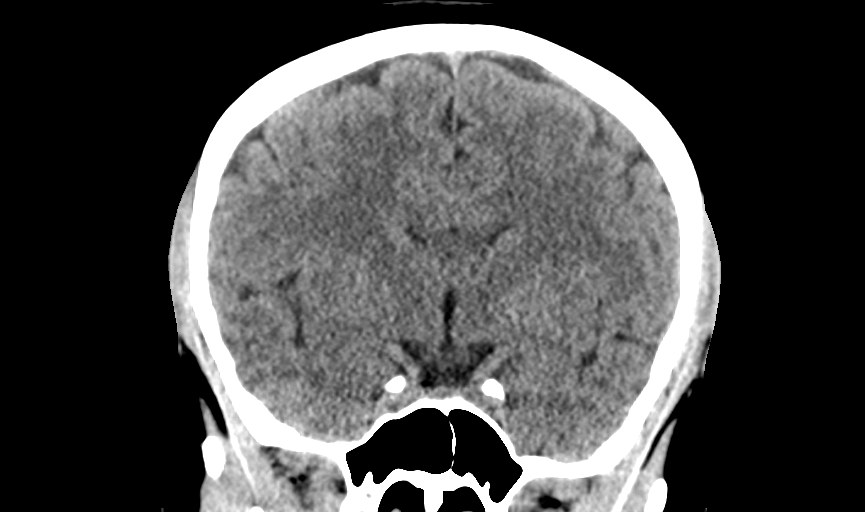
[im 40/72  brain]
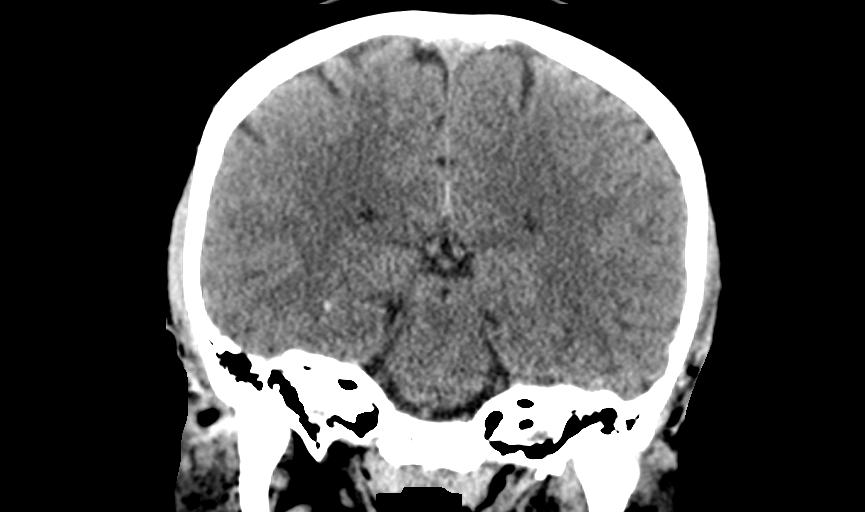

[Series 5: sagittal · sagittal · 0.28mm/px · 3 of 77 slices shown]
[im 26/77  brain]
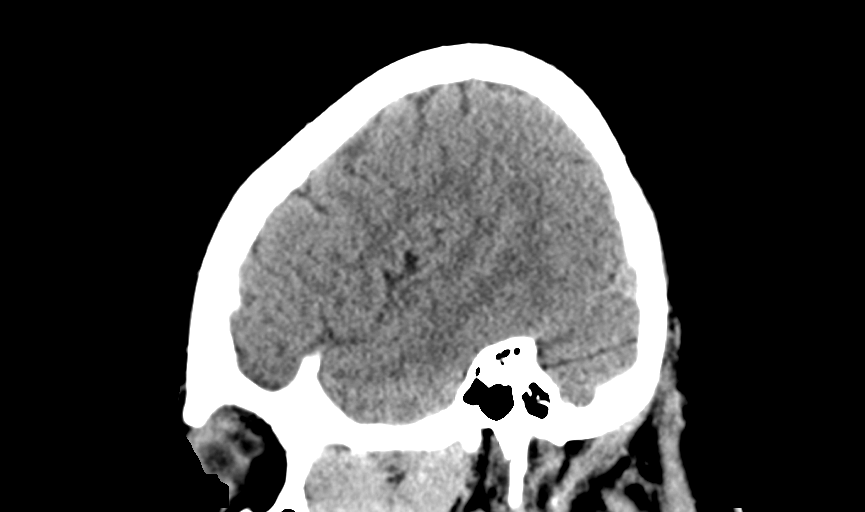
[im 39/77  brain]
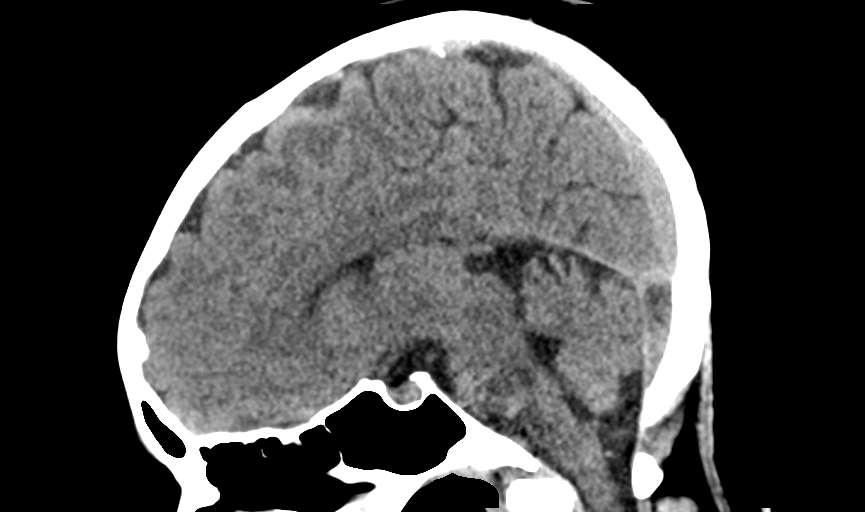
[im 51/77  brain]
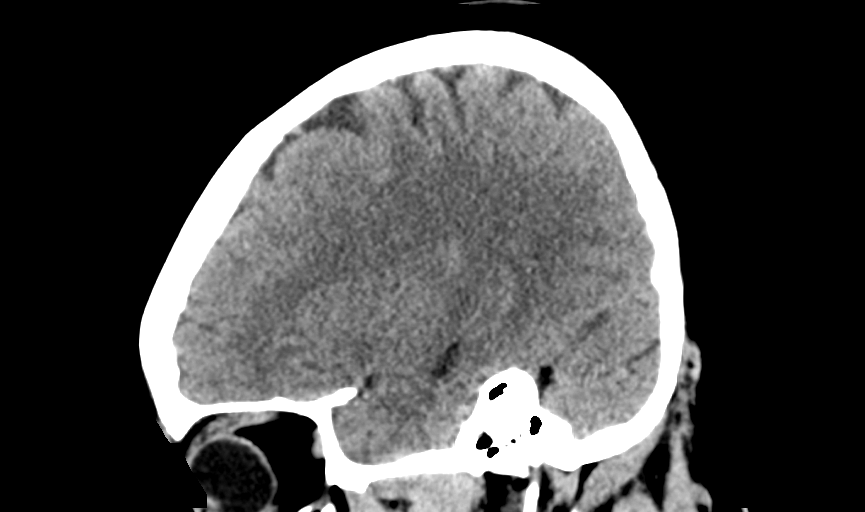

[14 of 47 positions shown; findings below may reference images not displayed]

FINDINGS: Brain: No evidence of acute infarction, hemorrhage, hydrocephalus,
extra-axial collection or mass lesion/mass effect.

Vascular: No hyperdense vessel or unexpected calcification.

Skull: Normal. Negative for fracture or focal lesion.

Sinuses/Orbits: No acute finding.

Other: None.
IMPRESSION: Negative noncontrast head CT.

## 2018-08-24 ENCOUNTER — Encounter: Payer: Self-pay | Admitting: Gynecology

## 2018-08-24 ENCOUNTER — Other Ambulatory Visit: Payer: Self-pay

## 2018-08-24 ENCOUNTER — Ambulatory Visit (INDEPENDENT_AMBULATORY_CARE_PROVIDER_SITE_OTHER): Payer: BLUE CROSS/BLUE SHIELD | Admitting: Gynecology

## 2018-08-24 VITALS — BP 124/80 | Ht 65.0 in | Wt 188.0 lb

## 2018-08-24 DIAGNOSIS — Z01419 Encounter for gynecological examination (general) (routine) without abnormal findings: Secondary | ICD-10-CM

## 2018-08-24 MED ORDER — IBUPROFEN 800 MG PO TABS: 800.0000 mg | ORAL_TABLET | Freq: Three times a day (TID) | ORAL | 4 refills | Status: DC | PRN

## 2018-08-24 NOTE — Patient Instructions (Signed)
Follow-up in 1 year, sooner as needed. 

## 2018-08-24 NOTE — Progress Notes (Signed)
    [place] May 30, 1986 [id number]        33 y.o.  G0P0000 for annual gynecologic exam.  Without gynecologic complaints.  Past medical history,surgical history, problem list, medications, allergies, family history and social history were all reviewed and documented as reviewed in the EPIC chart.  ROS:  Performed with pertinent positives and negatives included in the history, assessment and plan.   Additional significant findings : None   Exam: Catherine [name] assistant Vitals:   08/24/18 1056  BP: 124/80  Weight: 188 lb (85.3 kg)  Height: 5\' 5"  (1.651 m)   Body mass index is 31.28 kg/m.  General appearance:  Normal affect, orientation and appearance. Skin: Grossly normal HEENT: Without gross lesions.  No cervical or supraclavicular adenopathy. Thyroid normal.  Lungs:  Clear without wheezing, rales or rhonchi Cardiac: RR, without RMG Abdominal:  Soft, nontender, without masses, guarding, rebound, organomegaly or hernia Breasts:  Examined lying and sitting without masses, retractions, discharge or axillary adenopathy. Pelvic:  Ext, BUS, Vagina: Normal.  Virginal status  Cervix: Normal  Uterus: Anteverted, normal size, shape and contour, midline and mobile nontender   Adnexa: Without masses or tenderness    Anus and perineum: Normal    Assessment/Plan:  33 y.o. G0P0000 female for annual gynecologic exam.  Regular menses  1. Dysmenorrhea.  Had been on oral contraceptives for menstrual suppression but discontinued last year and is doing well on ibuprofen alone.  Ibuprofen 800 mg #60 with 4 refills provided. 2. Contraception not an issue.  Will follow-up if it does become an issue. 3. Breast health.  Breast exam normal today.  SBE monthly reviewed. 4. Pap smear/HPV 2017.  No Pap smear done today.  Plan repeat Pap smear at 5-year interval.  No history of abnormal Pap smears. 5. Health maintenance.  No routine lab work done as patient reports this done elsewhere.  Follow-up  1 year, sooner as needed.   Anastasio Auerbach MD, 11:18 AM 08/24/2018

## 2019-02-16 DIAGNOSIS — Z1159 Encounter for screening for other viral diseases: Secondary | ICD-10-CM | POA: Diagnosis not present

## 2019-02-17 DIAGNOSIS — Z0000 Encounter for general adult medical examination without abnormal findings: Secondary | ICD-10-CM | POA: Diagnosis not present

## 2019-02-23 DIAGNOSIS — Z23 Encounter for immunization: Secondary | ICD-10-CM | POA: Diagnosis not present

## 2019-02-23 DIAGNOSIS — Z0000 Encounter for general adult medical examination without abnormal findings: Secondary | ICD-10-CM | POA: Diagnosis not present

## 2019-03-06 ENCOUNTER — Encounter: Payer: Self-pay | Admitting: Gynecology

## 2019-05-10 ENCOUNTER — Other Ambulatory Visit: Payer: Self-pay | Admitting: Family Medicine

## 2019-05-10 DIAGNOSIS — R1030 Lower abdominal pain, unspecified: Secondary | ICD-10-CM | POA: Diagnosis not present

## 2019-05-10 DIAGNOSIS — K219 Gastro-esophageal reflux disease without esophagitis: Secondary | ICD-10-CM | POA: Diagnosis not present

## 2019-05-10 DIAGNOSIS — R109 Unspecified abdominal pain: Secondary | ICD-10-CM

## 2019-05-10 DIAGNOSIS — K5900 Constipation, unspecified: Secondary | ICD-10-CM | POA: Diagnosis not present

## 2019-05-19 ENCOUNTER — Ambulatory Visit
Admission: RE | Admit: 2019-05-19 | Discharge: 2019-05-19 | Disposition: A | Discharge status: Home / Self Care | Payer: BLUE CROSS/BLUE SHIELD | Source: Ambulatory Visit | Attending: Family Medicine | Admitting: Family Medicine

## 2019-05-19 DIAGNOSIS — R109 Unspecified abdominal pain: Secondary | ICD-10-CM

## 2019-06-01 DIAGNOSIS — K5900 Constipation, unspecified: Secondary | ICD-10-CM | POA: Diagnosis not present

## 2019-06-01 DIAGNOSIS — R1030 Lower abdominal pain, unspecified: Secondary | ICD-10-CM | POA: Diagnosis not present

## 2019-06-01 DIAGNOSIS — K219 Gastro-esophageal reflux disease without esophagitis: Secondary | ICD-10-CM | POA: Diagnosis not present

## 2019-06-15 ENCOUNTER — Other Ambulatory Visit: Payer: BC Managed Care – PPO

## 2019-06-20 ENCOUNTER — Other Ambulatory Visit: Payer: BC Managed Care – PPO

## 2019-08-25 ENCOUNTER — Encounter: Payer: BLUE CROSS/BLUE SHIELD | Admitting: Obstetrics and Gynecology

## 2019-09-14 ENCOUNTER — Ambulatory Visit: Payer: BC Managed Care – PPO | Attending: Internal Medicine

## 2019-09-14 DIAGNOSIS — Z23 Encounter for immunization: Secondary | ICD-10-CM

## 2019-09-14 NOTE — Progress Notes (Signed)
   Covid-19 Vaccination Clinic  Name:  Catherine [name]    MRN: [id number] DOB: 07-24-1985  09/14/2019  Catherine [name] was observed post Covid-19 immunization for 15 minutes without incident. She was provided with Vaccine Information Sheet and instruction to access the V-Safe system.   Catherine [name] was instructed to call 911 with any severe reactions post vaccine: Marland Kitchen Difficulty breathing  . Swelling of face and throat  . A fast heartbeat  . A bad rash all over body  . Dizziness and weakness   Immunizations Administered    Name Date Dose VIS Date Route   Pfizer COVID-19 Vaccine 09/14/2019 11:36 AM 0.3 mL 05/26/2019 Intramuscular   Manufacturer: [employer], [employer]   Lot: [id number]   [place]: [id number]

## 2019-10-09 ENCOUNTER — Ambulatory Visit: Payer: BC Managed Care – PPO

## 2019-10-11 ENCOUNTER — Ambulatory Visit: Payer: Self-pay | Attending: Internal Medicine

## 2019-10-11 DIAGNOSIS — Z23 Encounter for immunization: Secondary | ICD-10-CM

## 2019-10-11 NOTE — Progress Notes (Signed)
   Covid-19 Vaccination Clinic  Name:  Catherine [name]    MRN: [id number] DOB: 1985/11/16  10/11/2019  [name] was observed post Covid-19 immunization for 15 minutes without incident. She was provided with Vaccine Information Sheet and instruction to access the V-Safe system.   [name] was instructed to call 911 with any severe reactions post vaccine: Marland Kitchen Difficulty breathing  . Swelling of face and throat  . A fast heartbeat  . A bad rash all over body  . Dizziness and weakness   Immunizations Administered    Name Date Dose VIS Date Route   Pfizer COVID-19 Vaccine 10/11/2019  9:12 AM 0.3 mL 08/09/2018 Intramuscular   Manufacturer: [place]   Lot: [id number]   [place]: [id number]

## 2019-10-23 ENCOUNTER — Other Ambulatory Visit: Payer: Self-pay | Admitting: Family Medicine

## 2019-10-23 DIAGNOSIS — K219 Gastro-esophageal reflux disease without esophagitis: Secondary | ICD-10-CM | POA: Diagnosis not present

## 2019-10-23 DIAGNOSIS — R1013 Epigastric pain: Secondary | ICD-10-CM | POA: Diagnosis not present

## 2019-10-23 DIAGNOSIS — K5900 Constipation, unspecified: Secondary | ICD-10-CM | POA: Diagnosis not present

## 2019-10-24 ENCOUNTER — Ambulatory Visit
Admission: RE | Admit: 2019-10-24 | Discharge: 2019-10-24 | Disposition: A | Discharge status: Home / Self Care | Payer: BC Managed Care – PPO | Source: Ambulatory Visit | Attending: Family Medicine | Admitting: Family Medicine

## 2019-10-24 DIAGNOSIS — R1030 Lower abdominal pain, unspecified: Secondary | ICD-10-CM | POA: Diagnosis not present

## 2019-10-24 DIAGNOSIS — R1013 Epigastric pain: Secondary | ICD-10-CM

## 2019-10-24 DIAGNOSIS — K5900 Constipation, unspecified: Secondary | ICD-10-CM | POA: Diagnosis not present

## 2019-10-24 DIAGNOSIS — K219 Gastro-esophageal reflux disease without esophagitis: Secondary | ICD-10-CM | POA: Diagnosis not present

## 2019-10-26 DIAGNOSIS — K5900 Constipation, unspecified: Secondary | ICD-10-CM | POA: Diagnosis not present

## 2019-10-26 DIAGNOSIS — Z6836 Body mass index (BMI) 36.0-36.9, adult: Secondary | ICD-10-CM | POA: Diagnosis not present

## 2019-10-26 DIAGNOSIS — R1013 Epigastric pain: Secondary | ICD-10-CM | POA: Diagnosis not present

## 2019-10-26 DIAGNOSIS — K760 Fatty (change of) liver, not elsewhere classified: Secondary | ICD-10-CM | POA: Diagnosis not present

## 2019-11-06 ENCOUNTER — Encounter: Payer: Self-pay | Admitting: Obstetrics and Gynecology

## 2019-11-06 ENCOUNTER — Other Ambulatory Visit: Payer: Self-pay

## 2019-11-06 ENCOUNTER — Ambulatory Visit (INDEPENDENT_AMBULATORY_CARE_PROVIDER_SITE_OTHER): Payer: BC Managed Care – PPO | Admitting: Obstetrics and Gynecology

## 2019-11-06 VITALS — BP 118/74 | Ht 65.0 in | Wt 198.0 lb

## 2019-11-06 DIAGNOSIS — D259 Leiomyoma of uterus, unspecified: Secondary | ICD-10-CM

## 2019-11-06 DIAGNOSIS — Z01419 Encounter for gynecological examination (general) (routine) without abnormal findings: Secondary | ICD-10-CM

## 2019-11-06 DIAGNOSIS — R102 Pelvic and perineal pain: Secondary | ICD-10-CM

## 2019-11-06 MED ORDER — NORETHINDRONE ACET-ETHINYL EST 1-20 MG-MCG PO TABS: 1.0000 | ORAL_TABLET | Freq: Every day | ORAL | 6 refills | Status: DC

## 2019-11-06 NOTE — Progress Notes (Signed)
Catherine [name] Sep 04, 1985 [id number]  SUBJECTIVE:  34 y.o. G0P0000 female for annual routine gynecologic exam. She has no gynecologic concerns.  She does report some right lower quadrant discomfort sporadically, not related to her menstrual cycle.  She does have dysmenorrhea and now is only taking Tylenol scheduled starting the day before she expects her period, she says she has stomach ulcers so is no longer able to take ibuprofen.  She is currently not on birth control.  She is not currently sexually active.  She reports regular daily bowel movements no changes.  Current Outpatient Medications  Medication Sig Dispense Refill  . omeprazole (PRILOSEC) 40 MG capsule Take 40 mg by mouth daily.    . rosuvastatin (CRESTOR) 5 MG tablet Take 5 mg by mouth daily.     No current facility-administered medications for this visit.   Allergies: Patient has no known allergies.  Patient's last menstrual period was 10/16/2019.  Past medical history,surgical history, problem list, medications, allergies, family history and social history were all reviewed and documented as reviewed in the EPIC chart.  ROS:  Feeling well. No dyspnea or chest pain on exertion.  No abdominal pain, change in bowel habits, black or bloody stools.  No urinary tract symptoms. GYN ROS: normal menses, no abnormal bleeding, no discharge, no breast pain or new or enlarging lumps on self exam. No neurological complaints.   OBJECTIVE:  BP 118/74   Ht 5\' 5"  (1.651 m)   Wt 198 lb (89.8 kg)   LMP 10/16/2019   BMI 32.95 kg/m  The patient appears well, alert, oriented x 3, in no distress. ENT normal.  Neck supple. No cervical or supraclavicular adenopathy or thyromegaly.  Lungs are clear, good air entry, no wheezes, rhonchi or rales. S1 and S2 normal, no murmurs, regular rate and rhythm.  Abdomen soft without tenderness, guarding, mass or organomegaly.  Neurological is normal, no focal findings.  BREAST EXAM: breasts appear  normal, no suspicious masses, no skin or nipple changes or axillary nodes  PELVIC EXAM: VULVA: normal appearing vulva with no masses, tenderness or lesions, VAGINA: normal appearing vagina with normal color and discharge, no lesions, CERVIX: normal appearing cervix without discharge or lesions, UTERUS: uterus feels normal size and shape, exam somewhat limited by body habitus, uterus is nontender, ADNEXA: normal adnexa in size, nontender and no masses  Chaperone: Caryn Bee present during the examination  ASSESSMENT:  34 y.o. G0P0000 here for annual gynecologic exam  PLAN:   1. Dysmenorrhea and sporadic RLQ pain, history of fibroid uterus status post myomectomy in 2017.  No evidence of an ovarian cyst on the examination today, she is also nontender for the right adnexa.  May or may not be gynecologically related.  Sounds that the pain is not particularly any worse at any particular point in her her menstrual cycle.  We will empirically try combined oral contraceptive pills to see if that helps her out.  I wrote her a prescription for Junel which she has taken previously.  If the pain does not improve in the next 3 to 4 months, then we will recheck a pelvic ultrasound. 2. Pap smear/HPV 2017.  No significant history of abnormal Pap smears.  Next Pap smear due 2022 following the current guidelines recommending the 5 year co-testing interval. 3. Contraception.  Not currently sexually active, but we are restarting OCPs as above to see if that helps her pelvic discomfort. 4. Breast exam normal.  Encouraged self breast awareness. 5. Health maintenance.  No labs today as she normally has these completed with her primary care doctor.  Return annually or sooner, prn.  Joseph Pierini MD 11/06/19

## 2020-09-19 ENCOUNTER — Other Ambulatory Visit: Payer: Self-pay

## 2020-09-19 NOTE — Telephone Encounter (Signed)
AEX was with JK at 11/06/19.

## 2020-09-20 MED ORDER — IBUPROFEN 800 MG PO TABS: 800.0000 mg | ORAL_TABLET | Freq: Three times a day (TID) | ORAL | 3 refills | Status: DC | PRN

## 2020-11-06 ENCOUNTER — Encounter: Payer: BC Managed Care – PPO | Admitting: Obstetrics and Gynecology

## 2020-11-19 ENCOUNTER — Encounter: Payer: Self-pay | Admitting: Nurse Practitioner

## 2020-11-19 ENCOUNTER — Other Ambulatory Visit: Payer: Self-pay

## 2020-11-19 ENCOUNTER — Other Ambulatory Visit (HOSPITAL_COMMUNITY)
Admission: RE | Admit: 2020-11-19 | Discharge: 2020-11-19 | Disposition: A | Discharge status: Home / Self Care | Payer: BC Managed Care – PPO | Source: Ambulatory Visit | Attending: Nurse Practitioner | Admitting: Nurse Practitioner

## 2020-11-19 ENCOUNTER — Ambulatory Visit (INDEPENDENT_AMBULATORY_CARE_PROVIDER_SITE_OTHER): Payer: Managed Care, Other (non HMO) | Admitting: Nurse Practitioner

## 2020-11-19 VITALS — BP 114/74 | Ht 65.0 in | Wt 189.0 lb

## 2020-11-19 DIAGNOSIS — Z01419 Encounter for gynecological examination (general) (routine) without abnormal findings: Secondary | ICD-10-CM

## 2020-11-19 DIAGNOSIS — N946 Dysmenorrhea, unspecified: Secondary | ICD-10-CM | POA: Diagnosis not present

## 2020-11-19 NOTE — Patient Instructions (Signed)
Health Maintenance, Female Adopting a healthy lifestyle and getting preventive care are important in promoting health and wellness. Ask your health care provider about:  The right schedule for you to have regular tests and exams.  Things you can do on your own to prevent diseases and keep yourself healthy. What should I know about diet, weight, and exercise? Eat a healthy diet  Eat a diet that includes plenty of vegetables, fruits, low-fat dairy products, and lean protein.  Do not eat a lot of foods that are high in solid fats, added sugars, or sodium.   Maintain a healthy weight Body mass index (BMI) is used to identify weight problems. It estimates body fat based on height and weight. Your health care provider can help determine your BMI and help you achieve or maintain a healthy weight. Get regular exercise Get regular exercise. This is one of the most important things you can do for your health. Most adults should:  Exercise for at least 150 minutes each week. The exercise should increase your heart rate and make you sweat (moderate-intensity exercise).  Do strengthening exercises at least twice a week. This is in addition to the moderate-intensity exercise.  Spend less time sitting. Even light physical activity can be beneficial. Watch cholesterol and blood lipids Have your blood tested for lipids and cholesterol at 36 years of age, then have this test every 5 years. Have your cholesterol levels checked more often if:  Your lipid or cholesterol levels are high.  You are older than 36 years of age.  You are at high risk for heart disease. What should I know about cancer screening? Depending on your health history and family history, you may need to have cancer screening at various ages. This may include screening for:  Breast cancer.  Cervical cancer.  Colorectal cancer.  Skin cancer.  Lung cancer. What should I know about heart disease, diabetes, and high blood  pressure? Blood pressure and heart disease  High blood pressure causes heart disease and increases the risk of stroke. This is more likely to develop in people who have high blood pressure readings, are of African descent, or are overweight.  Have your blood pressure checked: ? Every 3-5 years if you are 18-39 years of age. ? Every year if you are 40 years old or older. Diabetes Have regular diabetes screenings. This checks your fasting blood sugar level. Have the screening done:  Once every three years after age 40 if you are at a normal weight and have a low risk for diabetes.  More often and at a younger age if you are overweight or have a high risk for diabetes. What should I know about preventing infection? Hepatitis B If you have a higher risk for hepatitis B, you should be screened for this virus. Talk with your health care provider to find out if you are at risk for hepatitis B infection. Hepatitis C Testing is recommended for:  Everyone born from 1945 through 1965.  Anyone with known risk factors for hepatitis C. Sexually transmitted infections (STIs)  Get screened for STIs, including gonorrhea and chlamydia, if: ? You are sexually active and are younger than 36 years of age. ? You are older than 36 years of age and your health care provider tells you that you are at risk for this type of infection. ? Your sexual activity has changed since you were last screened, and you are at increased risk for chlamydia or gonorrhea. Ask your health care provider   if you are at risk.  Ask your health care provider about whether you are at high risk for HIV. Your health care provider may recommend a prescription medicine to help prevent HIV infection. If you choose to take medicine to prevent HIV, you should first get tested for HIV. You should then be tested every 3 months for as long as you are taking the medicine. Pregnancy  If you are about to stop having your period (premenopausal) and  you may become pregnant, seek counseling before you get pregnant.  Take 400 to 800 micrograms (mcg) of folic acid every day if you become pregnant.  Ask for birth control (contraception) if you want to prevent pregnancy. Osteoporosis and menopause Osteoporosis is a disease in which the bones lose minerals and strength with aging. This can result in bone fractures. If you are 65 years old or older, or if you are at risk for osteoporosis and fractures, ask your health care provider if you should:  Be screened for bone loss.  Take a calcium or vitamin D supplement to lower your risk of fractures.  Be given hormone replacement therapy (HRT) to treat symptoms of menopause. Follow these instructions at home: Lifestyle  Do not use any products that contain nicotine or tobacco, such as cigarettes, e-cigarettes, and chewing tobacco. If you need help quitting, ask your health care provider.  Do not use street drugs.  Do not share needles.  Ask your health care provider for help if you need support or information about quitting drugs. Alcohol use  Do not drink alcohol if: ? Your health care provider tells you not to drink. ? You are pregnant, may be pregnant, or are planning to become pregnant.  If you drink alcohol: ? Limit how much you use to 0-1 drink a day. ? Limit intake if you are breastfeeding.  Be aware of how much alcohol is in your drink. In the [place] one drink equals one 12 oz bottle of beer (355 mL), one 5 oz glass of wine (148 mL), or one 1 oz glass of hard liquor (44 mL). General instructions  Schedule regular health, dental, and eye exams.  Stay current with your vaccines.  Tell your health care provider if: ? You often feel depressed. ? You have ever been abused or do not feel safe at home. Summary  Adopting a healthy lifestyle and getting preventive care are important in promoting health and wellness.  Follow your health care provider's instructions about healthy  diet, exercising, and getting tested or screened for diseases.  Follow your health care provider's instructions on monitoring your cholesterol and blood pressure. This information is not intended to replace advice given to you by your health care provider. Make sure you discuss any questions you have with your health care provider. Document Revised: 05/25/2018 Document Reviewed: 05/25/2018 Elsevier Patient Education  2021 [employer] Inc[employer]  

## 2020-11-19 NOTE — Progress Notes (Signed)
   Catherine [name] 30-Dec-1985 [id number]   History:  35 y.o. G0 presents for annual exam without GYN complaints. Monthly cycles. Was on OCPs for management of dysmenorrhea but she stopped these and takes Ibuprofen with good relief. Not sexually active. Normal pap history. 2017 myomectomy.  Gynecologic History Patient's last menstrual period was 10/25/2020. Period Cycle (Days): 28 Period Duration (Days): 6 Period Pattern: Regular Menstrual Flow: Moderate Dysmenorrhea: (!) Severe Dysmenorrhea Symptoms: Cramping Contraception/Family planning: OCP (estrogen/progesterone)  Health Maintenance Last Pap: 06/2015. Results were: normal  Last mammogram: Not indicated Last colonoscopy: Not indicated Last Dexa: Not indicated  Past medical history, past surgical history, family history and social history were all reviewed and documented in the EPIC chart.   ROS:  A ROS was performed and pertinent positives and negatives are included.  Exam:  Vitals:   11/19/20 1032  BP: 114/74  Weight: 189 lb (85.7 kg)  Height: 5\' 5"  (1.651 m)   Body mass index is 31.45 kg/m.  General appearance:  Normal Thyroid:  Symmetrical, normal in size, without palpable masses or nodularity. Respiratory  Auscultation:  Clear without wheezing or rhonchi Cardiovascular  Auscultation:  Regular rate, without rubs, murmurs or gallops  Edema/varicosities:  Not grossly evident Abdominal  Soft,nontender, without masses, guarding or rebound.  Liver/spleen:  No organomegaly noted  Hernia:  None appreciated  Skin  Inspection:  Grossly normal Breasts: Examined lying and sitting.   Right: Without masses, retractions, nipple discharge or axillary adenopathy.   Left: Without masses, retractions, nipple discharge or axillary adenopathy. Genitourinary   Inguinal/mons:  Normal without inguinal adenopathy  External genitalia:  Normal appearing vulva with no masses, tenderness, or lesions  BUS/Urethra/Skene's glands:   Normal  Vagina:  Normal appearing with normal color and discharge, no lesions  Cervix:  Normal appearing without discharge or lesions  Uterus:  Normal in size, shape and contour.  Midline and mobile, nontender  Adnexa/parametria:     Rt: Normal in size, without masses or tenderness.   Lt: Normal in size, without masses or tenderness.  Anus and perineum: Normal  Assessment/Plan:  35 y.o. G0 for annual exam.   Well female exam with routine gynecological exam - Education provided on SBEs, importance of preventative screenings, current guidelines, high calcium diet, regular exercise, and multivitamin daily. Labs with PCP this week.   Dysmenorrhea - stopped OCPs and having good management with Ibuprofen.   Screening for cervical cancer - Normal Pap history. Pap with HR HPV today.   Return in 1 year for annual.    Tamela Gammon DNP, 10:42 AM 11/19/2020

## 2020-11-20 LAB — CYTOLOGY - PAP
Comment: NEGATIVE
Diagnosis: NEGATIVE
High risk HPV: NEGATIVE

## 2021-11-26 ENCOUNTER — Other Ambulatory Visit: Payer: Self-pay | Admitting: Family Medicine

## 2021-11-27 ENCOUNTER — Other Ambulatory Visit: Payer: Self-pay | Admitting: Family Medicine

## 2021-11-27 DIAGNOSIS — R1013 Epigastric pain: Secondary | ICD-10-CM

## 2021-11-28 ENCOUNTER — Ambulatory Visit
Admission: EM | Admit: 2021-11-28 | Discharge: 2021-11-28 | Disposition: A | Discharge status: Home / Self Care | Payer: 59 | Attending: Emergency Medicine | Admitting: Emergency Medicine

## 2021-11-28 ENCOUNTER — Ambulatory Visit (INDEPENDENT_AMBULATORY_CARE_PROVIDER_SITE_OTHER): Payer: 59

## 2021-11-28 ENCOUNTER — Ambulatory Visit: Payer: 59

## 2021-11-28 DIAGNOSIS — N200 Calculus of kidney: Secondary | ICD-10-CM | POA: Diagnosis not present

## 2021-11-28 DIAGNOSIS — M546 Pain in thoracic spine: Secondary | ICD-10-CM

## 2021-11-28 DIAGNOSIS — M545 Low back pain, unspecified: Secondary | ICD-10-CM

## 2021-11-28 LAB — POCT URINALYSIS DIP (MANUAL ENTRY)
Bilirubin, UA: NEGATIVE
Blood, UA: NEGATIVE
Glucose, UA: NEGATIVE mg/dL
Leukocytes, UA: NEGATIVE
Nitrite, UA: NEGATIVE
Protein Ur, POC: NEGATIVE mg/dL
Spec Grav, UA: 1.025 (ref 1.010–1.025)
Urobilinogen, UA: 0.2 E.U./dL
pH, UA: 6.5 (ref 5.0–8.0)

## 2021-11-28 LAB — POCT URINE PREGNANCY: Preg Test, Ur: NEGATIVE

## 2021-11-28 MED ORDER — BACLOFEN 10 MG PO TABS: 10.0000 mg | ORAL_TABLET | Freq: Every day | ORAL | 0 refills | Status: AC

## 2021-11-28 NOTE — ED Provider Notes (Signed)
[facility]    CSN: [id number] Arrival date & time: 11/28/21  1447    HISTORY   Chief Complaint  Patient presents with   Back Pain    Entered by patient   HPI Catherine [name] is a 36 y.o. female. Patient presents to urgent care complaining of a 6-day history of pain in her mid back, describes the pain as burning in sensation and is worse when lying down.  Patient denies known acute injury or trauma to her back.  Patient states she is never had this before.  Patient states she is also having some numbness and tingling down her right leg at this time but denies lower back pain.  Patient denies repetitive movements, states she both sits and stands while working.  Patient denies doing any heavy lifting recently, states she does not have a small child that she is having to lift.  Patient denies numbness tingling in her upper extremities.  Patient states she would like to have x-rays of her back today to "get it of the way".  Patient initially stated that she was taking Tylenol with short-term relief of her symptoms.  Patient advised that I recommend anti-inflammatory pain medications, patient corrected me stating that she has been taking anti-inflammatory pain medications, ibuprofen 800 mg to be specific, states this only provides her with temporary relief.  EMR reviewed, I do not see prescription for ibuprofen 800 mg in her chart, patient states she does not recall why this was provided for her.  Patient states she has CT of abdomen pelvis scheduled for next week, states her primary care provider is concerned she may have a kidney stone on her right side where she is currently experiencing discomfort.  The history is provided by the patient.   Past Medical History:  Diagnosis Date   Allergy    Seasonal   Anemia    Anemia 2018   Elevated cholesterol    GERD (gastroesophageal reflux disease)    with spicy food - no med   Headache    Scoliosis    Ulcer, stomach peptic     Patient Active Problem List   Diagnosis Date Noted   Anemia    Leiomyoma 02/11/2016   Past Surgical History:  Procedure Laterality Date   LAPAROTOMY N/A 02/11/2016   Procedure: EXPLORATORY LAPAROTOMY;  Surgeon: Anastasio Auerbach, MD;  Location: [facility];  Service: Gynecology;  Laterality: N/A;   MYOMECTOMY N/A 02/11/2016   Procedure: MYOMECTOMY;  Surgeon: Anastasio Auerbach, MD;  Location: [facility];  Service: Gynecology;  Laterality: N/A;   OB History     Gravida  0   Para  0   Term  0   Preterm  0   AB  0   Living  0      SAB  0   IAB  0   Ectopic  0   Multiple  0   Live Births             Home Medications    Prior to Admission medications   Medication Sig Start Date End Date Taking? Authorizing Provider    Family History Family History  Problem Relation Age of Onset   Hypertension Mother    Social History Social History   Tobacco Use   Smoking status: Never   Smokeless tobacco: Never  Vaping Use   Vaping Use: Never used  Substance Use Topics   Alcohol use: Yes    Alcohol/week: 0.0 standard drinks of  alcohol    Comment: Occas.   Drug use: No   Allergies   Patient has no known allergies.  Review of Systems Review of Systems Pertinent findings noted in history of present illness.   Physical Exam Triage Vital Signs ED Triage Vitals  Enc Vitals Group     BP 04/11/21 0827 (!) 147/82     Pulse Rate 04/11/21 0827 72     Resp 04/11/21 0827 18     Temp 04/11/21 0827 98.3 F (36.8 C)     Temp Source 04/11/21 0827 Oral     SpO2 04/11/21 0827 98 %     Weight --      Height --      Head Circumference --      Peak Flow --      Pain Score 04/11/21 0826 5     Pain Loc --      Pain Edu? --      Excl. in [place]? --   No data found.  Updated Vital Signs BP 113/80 (BP Location: Right Arm)   Pulse 97   Temp 98.2 F (36.8 C) (Oral)   Resp 16   LMP 11/14/2021   SpO2 98%   Physical Exam Constitutional:      General: She is awake.      Appearance: Normal appearance. She is well-developed and well-groomed. She is morbidly obese. She is not diaphoretic.  Musculoskeletal:     Cervical back: Normal.     Lumbar back: Normal.     Comments: Patient jumped and cried in pain when I initially palpated  T4.  When I palpated the same area again after palpating her cervical and lumbar spine, patient reported no tenderness to palpation of T3, T4 and T5 or thoracic paraspinous muscles.  Neurological:     Mental Status: She is alert.  Psychiatric:        Behavior: Behavior is cooperative.     Visual Acuity Right Eye Distance:   Left Eye Distance:   Bilateral Distance:    Right Eye Near:   Left Eye Near:    Bilateral Near:     [facility] / Diagnostics / Procedures:    EKG  Radiology DG Lumbar Spine 2-3 Views  Result Date: 11/28/2021 CLINICAL DATA:  Mid to lower back pain for 6 days EXAM: LUMBAR SPINE - 2-3 VIEW COMPARISON:  11/28/2021 FINDINGS: There is no evidence of lumbar spine fracture. Alignment is normal. Intervertebral disc spaces are maintained. Subcentimeter radiopaque calculus projects over the left kidney lower pole compatible with left nephrolithiasis. IMPRESSION: No acute osseous finding of the lumbar spine Left nephrolithiasis Electronically Signed   By: Jerilynn Mages.  Shick M.D.   On: 11/28/2021 16:12   DG Thoracic Spine 2 View  Result Date: 11/28/2021 CLINICAL DATA:  Midthoracic back pain for 6 days EXAM: THORACIC SPINE 2 VIEWS COMPARISON:  11/28/2021 FINDINGS: There is no evidence of thoracic spine fracture. Alignment is normal. No other significant bone abnormalities are identified. IMPRESSION: Negative. Electronically Signed   By: Jerilynn Mages.  Shick M.D.   On: 11/28/2021 16:11    Procedures Procedures (including critical care time)  UC Diagnoses / Final Clinical Impressions(s)   I have reviewed the triage vital signs and the nursing notes.  Pertinent labs & imaging results that were available during my care of the patient were  reviewed by me and considered in my medical decision making (see chart for details).    Final diagnoses:  Thoracic back pain, unspecified back pain laterality,  unspecified chronicity  Left nephrolithiasis    X-ray of thoracic spine revealed left-sided kidney stone.  Patient denies left or right-sided abdominal pain, left right-sided flank pain at this time.  Patient already has a CT of her abdomen and pelvis scheduled for next week, patient did not advise me of this when she arrived to the urgent care today, had she done so I would not have ordered x-rays.  Patient advised that her urine dip today is unremarkable and that because she is not experiencing any pain at this time, no further treatment is necessary.  Patient provided with baclofen for her reported thoracic back pain.  Patient advised to follow-up with primary care.    ED Prescriptions     Medication Sig Dispense Auth. Provider   baclofen (LIORESAL) 10 MG tablet Take 1 tablet (10 mg total) by mouth at bedtime for 7 days. 7 tablet Lynden Oxford Scales, PA-C      PDMP not reviewed this encounter.  Pending results:  Labs Reviewed  POCT URINALYSIS DIP (MANUAL ENTRY) - Abnormal; Notable for the following components:      Result Value   Ketones, POC UA trace (5) (*)    All other components within normal limits  POCT URINE PREGNANCY    Medications Ordered in UC: Medications - No data to display  Disposition Upon Discharge:  Condition: stable for discharge home Home: take medications as prescribed; routine discharge instructions as discussed; follow up as advised.  Patient presented with an acute illness with associated systemic symptoms and significant discomfort requiring urgent management. In my opinion, this is a condition that a prudent lay person (someone who possesses an average knowledge of health and medicine) may potentially expect to result in complications if not addressed urgently such as respiratory distress,  impairment of bodily function or dysfunction of bodily organs.   Routine symptom specific, illness specific and/or disease specific instructions were discussed with the patient and/or caregiver at length.   As such, the patient has been evaluated and assessed, work-up was performed and treatment was provided in alignment with urgent care protocols and evidence based medicine.  Patient/parent/caregiver has been advised that the patient may require follow up for further testing and treatment if the symptoms continue in spite of treatment, as clinically indicated and appropriate.  If the patient was tested for COVID-19, Influenza and/or RSV, then the patient/parent/guardian was advised to isolate at home pending the results of his/her diagnostic coronavirus test and potentially longer if they're positive. I have also advised pt that if his/her COVID-19 test returns positive, it's recommended to self-isolate for at least 10 days after symptoms first appeared AND until fever-free for 24 hours without fever reducer AND other symptoms have improved or resolved. Discussed self-isolation recommendations as well as instructions for household member/close contacts as per the [facility] and [place] [facility]HS, and also gave patient the [place] packet with this information.  Patient/parent/caregiver has been advised to return to the [facility] or PCP in 3-5 days if no better; to PCP or the Emergency Department if new signs and symptoms develop, or if the current signs or symptoms continue to change or worsen for further workup, evaluation and treatment as clinically indicated and appropriate  The patient will follow up with their current PCP if and as advised. If the patient does not currently have a PCP we will assist them in obtaining one.   The patient may need specialty follow up if the symptoms continue, in spite of conservative treatment and management, for  further workup, evaluation, consultation and treatment as clinically indicated  and appropriate.   Patient/parent/caregiver verbalized understanding and agreement of plan as discussed.  All questions were addressed during visit.  Please see discharge instructions below for further details of plan.  Discharge Instructions:   Discharge Instructions      The imaging of your spine did not reveal any acute bony abnormalities.  The imaging of your thoracic spine did reveal a left-sided kidney stone.  Because your symptoms are largely right-sided, I cannot definitively tell you whether or not this stone is concerning at this time.  I do recommend that you have the imaging of your abdomen performed and your primary care provider has ordered to further evaluate the location of the stone and make sure that it is not obstructing urine flow.  Your urinalysis today is not remarkable for any signs of acute infection.  Because you are not experienced any left-sided abdominal pain or back pain, I do not recommend antibiotics at this time.  If you notice that you begin to have worsening left-sided back pain or abdominal pain, began to develop a fever or notice blood in your urine, please follow-up with your primary care provider or return to urgent care for repeat evaluation.  I have provided you with a muscle relaxer for your back pain at this time.  You are welcome to continue ibuprofen and Tylenol as you have been.  Thank you for visiting urgent care today.      This office note has been dictated using Museum/gallery curator.  Unfortunately, and despite my best efforts, this method of dictation can sometimes lead to occasional typographical or grammatical errors.  I apologize in advance if this occurs.     Lynden Oxford Scales, PA-C 11/28/21 1641

## 2021-11-28 NOTE — Discharge Instructions (Addendum)
The imaging of your spine did not reveal any acute bony abnormalities.  The imaging of your thoracic spine did reveal a left-sided kidney stone.  Because your symptoms are largely right-sided, I cannot definitively tell you whether or not this stone is concerning at this time.  I do recommend that you have the imaging of your abdomen performed and your primary care provider has ordered to further evaluate the location of the stone and make sure that it is not obstructing urine flow.  Your urinalysis today is not remarkable for any signs of acute infection.  Because you are not experienced any left-sided abdominal pain or back pain, I do not recommend antibiotics at this time.  If you notice that you begin to have worsening left-sided back pain or abdominal pain, began to develop a fever or notice blood in your urine, please follow-up with your primary care provider or return to urgent care for repeat evaluation.  I have provided you with a muscle relaxer for your back pain at this time.  You are welcome to continue ibuprofen and Tylenol as you have been.  Thank you for visiting urgent care today.

## 2021-11-28 NOTE — ED Notes (Signed)
Rounding on patient No needs at this time. 

## 2021-11-28 NOTE — ED Triage Notes (Signed)
Pt c/o back pain (mid). Started: 6 days ago Home interventions: motrin

## 2021-12-05 ENCOUNTER — Ambulatory Visit
Admission: RE | Admit: 2021-12-05 | Discharge: 2021-12-05 | Disposition: A | Discharge status: Home / Self Care | Payer: 59 | Source: Ambulatory Visit | Attending: Family Medicine | Admitting: Family Medicine

## 2021-12-05 DIAGNOSIS — R1013 Epigastric pain: Secondary | ICD-10-CM

## 2021-12-17 ENCOUNTER — Encounter: Payer: Self-pay | Admitting: Nurse Practitioner

## 2021-12-17 ENCOUNTER — Ambulatory Visit (INDEPENDENT_AMBULATORY_CARE_PROVIDER_SITE_OTHER): Payer: 59 | Admitting: Nurse Practitioner

## 2021-12-17 VITALS — BP 120/80 | HR 88 | Ht 64.0 in | Wt 198.0 lb

## 2021-12-17 DIAGNOSIS — Z01419 Encounter for gynecological examination (general) (routine) without abnormal findings: Secondary | ICD-10-CM

## 2021-12-17 DIAGNOSIS — R1031 Right lower quadrant pain: Secondary | ICD-10-CM | POA: Diagnosis not present

## 2021-12-17 DIAGNOSIS — N858 Other specified noninflammatory disorders of uterus: Secondary | ICD-10-CM

## 2021-12-17 NOTE — Progress Notes (Signed)
   Catherine [name] 08/05/1985 [id number]   History:  36 y.o. G0 presents for annual exam. She has been having right sided abdominal pain, seen by PCP for this. Pain has been ongoing for months to years per patient, sharp, intermittent, nothing makes it worse or better. Started on Prilosec for GERD symptoms, cholelithiasis without cholecystitis seen on RUQ abdominal ultrasound 12/05/2021. Monthly cycles. H/O severe dysmenorrhea managed well with Ibuprofen. Does feel her menses are becoming heavier for 1-2 days each cycle. 2017 myomectomy. Pre-diabetes, HLD managed by PCP.   Gynecologic History Patient's last menstrual period was 12/14/2021 (exact date). Period Duration (Days): 6 Period Pattern: Regular Menstrual Flow: Moderate Menstrual Control: Maxi pad Dysmenorrhea: (!) Severe Dysmenorrhea Symptoms: Cramping Contraception/Family planning: abstinence Sexually active: No  Health Maintenance Last Pap: 11/19/2020. Results were: Normal, 5-year repeat  Last mammogram: Not indicated Last colonoscopy: Not indicated Last Dexa: Not indicated  Past medical history, past surgical history, family history and social history were all reviewed and documented in the EPIC chart. Single.   ROS:  A ROS was performed and pertinent positives and negatives are included.  Exam:  Vitals:   12/17/21 1405  BP: 120/80  Pulse: 88  SpO2: 99%  Weight: 198 lb (89.8 kg)  Height: '5\' 4"'$  (1.626 m)    Body mass index is 33.99 kg/m.  General appearance:  Normal Thyroid:  Symmetrical, normal in size, without palpable masses or nodularity. Respiratory  Auscultation:  Clear without wheezing or rhonchi Cardiovascular  Auscultation:  Regular rate, without rubs, murmurs or gallops  Edema/varicosities:  Not grossly evident Abdominal  Soft,nontender, without masses, guarding or rebound.  Liver/spleen:  No organomegaly noted  Hernia:  None appreciated  Skin  Inspection:  Grossly normal Breasts: Examined  lying and sitting.   Right: Without masses, retractions, nipple discharge or axillary adenopathy.   Left: Without masses, retractions, nipple discharge or axillary adenopathy. Genitourinary   Inguinal/mons:  Normal without inguinal adenopathy  External genitalia:  Normal appearing vulva with no masses, tenderness, or lesions  BUS/Urethra/Skene's glands:  Normal  Vagina:  Normal appearing with normal color and discharge, no lesions  Cervix:  Normal appearing without discharge or lesions  Uterus:  12-14 week uterus, nontender  Adnexa/parametria:     Rt: Normal in size, without masses or tenderness.   Lt: Normal in size, without masses or tenderness.  Anus and perineum: Normal  Patient informed chaperone available to be present for breast and pelvic exam. Patient has requested no chaperone to be present. Patient has been advised what will be completed during breast and pelvic exam.   Assessment/Plan:  36 y.o. G0 for annual exam.   Well female exam with routine gynecological exam - Education provided on SBEs, importance of preventative screenings, current guidelines, high calcium diet, regular exercise, and multivitamin daily. Labs with PCP.  Right lower quadrant abdominal pain - Plan: US PELVIS TRANSVAGINAL NON-OB (TV ONLY). Pain has been ongoing for months to years per patient, sharp, intermittent, nothing makes it worse or better. Evaluated by PCP, started on Prilosec for GERD, cholelithiasis without cholecystitis seen on RUQ abdominal ultrasound 12/05/2021. H/O myomectomy in 2017. Menstrual flow has increased as well, likely fibroid/s.  Uterine mass - Plan: US PELVIS TRANSVAGINAL NON-OB (TV ONLY). H/O myomectomy. 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   Screening for cervical cancer - Normal Pap history. Will repeat at 5-year interval per guidelines.   Return in 1 year for annual.      Tamela Gammon DNP, 2:27 PM 12/17/2021

## 2021-12-18 ENCOUNTER — Ambulatory Visit
Admission: RE | Admit: 2021-12-18 | Discharge: 2021-12-18 | Disposition: A | Discharge status: Home / Self Care | Payer: 59 | Source: Ambulatory Visit | Attending: Family Medicine | Admitting: Family Medicine

## 2021-12-18 VITALS — BP 107/74 | HR 77 | Temp 98.2°F | Resp 18

## 2021-12-18 DIAGNOSIS — D259 Leiomyoma of uterus, unspecified: Secondary | ICD-10-CM

## 2021-12-18 DIAGNOSIS — N852 Hypertrophy of uterus: Secondary | ICD-10-CM

## 2021-12-18 NOTE — ED Triage Notes (Signed)
Pt c/o abd pain and states she is passing black stool.   Started: Tuesday  Home interventions: pepto bismal

## 2021-12-18 NOTE — ED Provider Notes (Signed)
[facility]    CSN: [id number] Arrival date & time: 12/18/21  1500    HISTORY   Chief Complaint  Patient presents with   Abdominal Pain    Passing black stool - Entered by patient   HPI Catherine [name] is a 36 y.o. female. Patient presents to urgent care today complaining of abdominal pain, states has been taking Pepto-Bismol and has been passing black stools.  Patient states her symptoms began 2 days ago.  Patient was seen at this clinic on November 28, 2021 where it was discovered incidentally that she had a kidney stone, x-ray abdomen was performed due to patient complaining of abdominal pain.  EMR reviewed.  Patient had an abdominal ultrasound on June 23 which revealed cholelithiasis without signs of cholecystitis.  Patient was seen by her gynecologist yesterday who performed a transvaginal ultrasound which revealed uterine fibroids in her uterus size of 12 to 14 weeks.  Per DNP note, patient has had myomectomy in the past, is currently having severe pain with her periods, DNP recommended for her to have a repeat annual exam in 1 year, no further interventions were recommended.  Patient states she has never had children and would still like to get pregnant but has not been able to do so.  The history is provided by the patient.   Past Medical History:  Diagnosis Date   Allergy    Seasonal   Anemia    Anemia 2018   Elevated cholesterol    GERD (gastroesophageal reflux disease)    with spicy food - no med   Headache    Kidney stone    Prediabetes    Scoliosis    Ulcer, stomach peptic    Patient Active Problem List   Diagnosis Date Noted   Anemia    Leiomyoma 02/11/2016   Past Surgical History:  Procedure Laterality Date   LAPAROTOMY N/A 02/11/2016   Procedure: EXPLORATORY LAPAROTOMY;  Surgeon: Anastasio Auerbach, MD;  Location: [facility];  Service: Gynecology;  Laterality: N/A;   MYOMECTOMY N/A 02/11/2016   Procedure: MYOMECTOMY;  Surgeon: Anastasio Auerbach, MD;   Location: [facility];  Service: Gynecology;  Laterality: N/A;   OB History     Gravida  0   Para  0   Term  0   Preterm  0   AB  0   Living  0      SAB  0   IAB  0   Ectopic  0   Multiple  0   Live Births             Home Medications    Prior to Admission medications   Medication Sig Start Date End Date Taking? Authorizing Provider  Azelastine HCl 137 MCG/SPRAY SOLN SMARTSIG:1-2 Spray(s) Both Nares 1-2 Times Daily 10/01/21   [provider]  omeprazole (PRILOSEC) 20 MG capsule Take 20 mg by mouth every morning. 11/26/21   [provider]  rosuvastatin (CRESTOR) 5 MG tablet Take 5 mg by mouth daily.    [provider]  Vitamin D, Ergocalciferol, (DRISDOL) 1.25 MG (50000 UNIT) CAPS capsule Take 50,000 Units by mouth once a week. 11/27/21   [provider]  WEGOVY 0.25 MG/0.5ML SOAJ Inject into the skin. 11/27/21   [provider]    Family History Family History  Problem Relation Age of Onset   Hypertension Mother    Social History Social History   Tobacco Use   Smoking status: Never  Smokeless tobacco: Never  Vaping Use   Vaping Use: Never used  Substance Use Topics   Alcohol use: Not Currently    Comment: Occas.   Drug use: No   Allergies   Patient has no known allergies.  Review of Systems Review of Systems Pertinent findings noted in history of present illness.   Physical Exam Triage Vital Signs ED Triage Vitals  Enc Vitals Group     BP 04/11/21 0827 (!) 147/82     Pulse Rate 04/11/21 0827 72     Resp 04/11/21 0827 18     Temp 04/11/21 0827 98.3 F (36.8 C)     Temp Source 04/11/21 0827 Oral     SpO2 04/11/21 0827 98 %     Weight --      Height --      Head Circumference --      Peak Flow --      Pain Score 04/11/21 0826 5     Pain Loc --      Pain Edu? --      Excl. in [place]? --   No data found.  Updated Vital Signs BP 107/74 (BP Location: Right Arm)   Pulse 77   Temp 98.2 F (36.8 C)  (Oral)   Resp 18   LMP 12/14/2021 (Exact Date)   SpO2 98%   Physical Exam Vitals and nursing note reviewed.  Constitutional:      General: She is not in acute distress.    Appearance: Normal appearance.  HENT:     Head: Normocephalic and atraumatic.  Eyes:     Pupils: Pupils are equal, round, and reactive to light.  Cardiovascular:     Rate and Rhythm: Normal rate and regular rhythm.  Pulmonary:     Effort: Pulmonary effort is normal.     Breath sounds: Normal breath sounds.  Abdominal:     General: Abdomen is flat. Bowel sounds are normal.     Palpations: Abdomen is soft.  Musculoskeletal:        General: Normal range of motion.     Cervical back: Normal range of motion and neck supple.  Skin:    General: Skin is warm and dry.  Neurological:     General: No focal deficit present.     Mental Status: She is alert and oriented to person, place, and time. Mental status is at baseline.  Psychiatric:        Mood and Affect: Mood normal.        Behavior: Behavior normal.        Thought Content: Thought content normal.        Judgment: Judgment normal.     Visual Acuity Right Eye Distance:   Left Eye Distance:   Bilateral Distance:    Right Eye Near:   Left Eye Near:    Bilateral Near:     UC Couse / Diagnostics / Procedures:    EKG  Radiology No results found.  Procedures Procedures (including critical care time)  UC Diagnoses / Final Clinical Impressions(s)   I have reviewed the triage vital signs and the nursing notes.  Pertinent labs & imaging results that were available during my care of the patient were reviewed by me and considered in my medical decision making (see chart for details).    Final diagnoses:  Uterine leiomyoma, unspecified location  Bulky or enlarged uterus   Physical exam today is unremarkable, patient is in no acute distress.  Patient advised to follow-up  with another gynecologist given that her current gynecologist does not seem to  have a plan of action for her.  Return precautions advised.  ED Prescriptions   None    PDMP not reviewed this encounter.  Pending results:  Labs Reviewed - No data to display  Medications Ordered in UC: Medications - No data to display  Disposition Upon Discharge:  Condition: stable for discharge home Home: take medications as prescribed; routine discharge instructions as discussed; follow up as advised.  Patient presented with an acute illness with associated systemic symptoms and significant discomfort requiring urgent management. In my opinion, this is a condition that a prudent lay person (someone who possesses an average knowledge of health and medicine) may potentially expect to result in complications if not addressed urgently such as respiratory distress, impairment of bodily function or dysfunction of bodily organs.   Routine symptom specific, illness specific and/or disease specific instructions were discussed with the patient and/or caregiver at length.   As such, the patient has been evaluated and assessed, work-up was performed and treatment was provided in alignment with urgent care protocols and evidence based medicine.  Patient/parent/caregiver has been advised that the patient may require follow up for further testing and treatment if the symptoms continue in spite of treatment, as clinically indicated and appropriate.  Patient/parent/caregiver has been advised to return to the [facility] or PCP if no better; to PCP or the Emergency Department if new signs and symptoms develop, or if the current signs or symptoms continue to change or worsen for further workup, evaluation and treatment as clinically indicated and appropriate  The patient will follow up with their current PCP if and as advised. If the patient does not currently have a PCP we will assist them in obtaining one.   The patient may need specialty follow up if the symptoms continue, in spite of conservative treatment  and management, for further workup, evaluation, consultation and treatment as clinically indicated and appropriate.   Patient/parent/caregiver verbalized understanding and agreement of plan as discussed.  All questions were addressed during visit.  Please see discharge instructions below for further details of plan.  Discharge Instructions:   Discharge Instructions      Please reach out to Dr. Dorien Chihuahua at [place], is easiest to get an appointment with her at the [facility] at [address] in [place], the appointment phone number is [phone].  To read more about her, please visit   [email or web address]  Thank you for visiting urgent care today.      This office note has been dictated using Museum/gallery curator.  Unfortunately, and despite my best efforts, this method of dictation can sometimes lead to occasional typographical or grammatical errors.  I apologize in advance if this occurs.     Lynden Oxford Scales, PA-C 12/20/21 1642

## 2021-12-18 NOTE — Discharge Instructions (Signed)
Please reach out to Dr. Dorien Chihuahua at [facility], is easiest to get an appointment with her at the [facility] at [address] in [place], the appointment phone number is [phone].  To read more about her, please visit   [email or web address]  Thank you for visiting urgent care today.

## 2021-12-22 ENCOUNTER — Encounter: Payer: Self-pay | Admitting: Nurse Practitioner

## 2022-01-21 ENCOUNTER — Ambulatory Visit (INDEPENDENT_AMBULATORY_CARE_PROVIDER_SITE_OTHER): Payer: 59

## 2022-01-21 ENCOUNTER — Encounter: Payer: Self-pay | Admitting: Nurse Practitioner

## 2022-01-21 ENCOUNTER — Ambulatory Visit (INDEPENDENT_AMBULATORY_CARE_PROVIDER_SITE_OTHER): Payer: 59 | Admitting: Nurse Practitioner

## 2022-01-21 VITALS — BP 98/62 | HR 77

## 2022-01-21 DIAGNOSIS — N809 Endometriosis, unspecified: Secondary | ICD-10-CM

## 2022-01-21 DIAGNOSIS — N858 Other specified noninflammatory disorders of uterus: Secondary | ICD-10-CM | POA: Diagnosis not present

## 2022-01-21 DIAGNOSIS — D251 Intramural leiomyoma of uterus: Secondary | ICD-10-CM | POA: Diagnosis not present

## 2022-01-21 DIAGNOSIS — R1031 Right lower quadrant pain: Secondary | ICD-10-CM | POA: Diagnosis not present

## 2022-01-21 DIAGNOSIS — D252 Subserosal leiomyoma of uterus: Secondary | ICD-10-CM | POA: Diagnosis not present

## 2022-01-21 MED ORDER — NORETHIN ACE-ETH ESTRAD-FE 1-20 MG-MCG PO TABS: 1.0000 | ORAL_TABLET | Freq: Every day | ORAL | 3 refills | Status: AC

## 2022-01-21 NOTE — Progress Notes (Signed)
   Acute Office Visit  Subjective:    Patient ID: Catherine [name], female    DOB: 16-Jul-1985, 36 y.o.   MRN: [id number]   HPI 36 y.o. G0 presents today for ultrasound follow up. She has been having right sided abdominal pain, saw PCP for this. Pain has been ongoing for months to years per patient and is sharp, intermittent, nothing makes it worse or better. Started on Prilosec for GERD symptoms, cholelithiasis without cholecystitis seen on RUQ abdominal ultrasound 12/05/2021. Monthly cycles. H/O severe dysmenorrhea managed well with Ibuprofen. Does feel her menses are becoming heavier for 1-2 days each cycle. Reports last cycle was not heavy. 2017 myomectomy.   Review of Systems  Constitutional: Negative.   Gastrointestinal:  Positive for abdominal pain (Right sided pain).  Genitourinary:  Positive for menstrual problem.       Objective:    Physical Exam Constitutional:      Appearance: Normal appearance.   GU: Not indicated  BP 98/62   Pulse 77   LMP 01/05/2022 (Exact Date)   SpO2 99%  Wt Readings from Last 3 Encounters:  12/17/21 198 lb (89.8 kg)  11/19/20 189 lb (85.7 kg)  11/06/19 198 lb (89.8 kg)         Assessment & Plan:   Problem List Items Addressed This Visit   None Visit Diagnoses     Right lower quadrant abdominal pain    -  Primary   Intramural and subserous leiomyoma of uterus       Relevant Medications   norethindrone-ethinyl estradiol-FE (LOESTRIN FE) 1-20 MG-MCG tablet   Endometriosis       Suspected       Vaginal ultrasound: Retroverted enlarged uterus. Multiple intramural and subserosal fibroids noted - largest is anterior left lateral subserosal measuring 5.4 x 6.4 cm. Secretory endometrium measuring approximately 9.08 mm.  No obvious masses or thickening seen.  Both ovaries normal in appearance with normal follicle pattern with positive perfusion.  Left ovary is slightly enlarged, right ovary is normal in size.  Both ovaries appear  "stuck" to the lateral walls of the uterus.  No adnexal masses.  Trace amount of free fluid in the left adnexa.  Plan:  Discussed ultrasound results and findings of fibroids and suspected endometriosis due to adhesion of ovaries to uterus. We discussed endometriosis in detail including clinical versus surgical diagnosis, symptoms, and management. She is considering pregnancy with sperm donor in the future, so we discussed endometriosis and fertility. Will start COCs for management of menorrhagia and dysmenorrhea. She has been on COCs in the past and tolerated well.      [place], 1:42 PM 01/21/2022

## 2022-03-12 NOTE — Therapy (Signed)
OUTPATIENT PHYSICAL THERAPY THORACOLUMBAR EVALUATION   Patient Name: Catherine [name] MRN: [id number] DOB:[birth year], 36 y.o., female Today's Date: 03/13/2022   PT End of Session - 03/13/22 1221     Visit Number 1    Number of Visits 7    Date for PT Re-Evaluation 05/08/22    Authorization Type [employer]    Authorization Time Period no auth required, FOTO v6/10    Progress Note Due on Visit 10    PT Start Time 1225    PT Stop Time 1304    PT Time Calculation (min) 39 min    Activity Tolerance Patient tolerated treatment well    Behavior During Therapy WFL for tasks assessed/performed             Past Medical History:  Diagnosis Date   Allergy    Seasonal   Anemia    Anemia 2018   Elevated cholesterol    GERD (gastroesophageal reflux disease)    with spicy food - no med   Headache    Kidney stone    Prediabetes    Scoliosis    Ulcer, stomach peptic    Past Surgical History:  Procedure Laterality Date   LAPAROTOMY N/A 02/11/2016   Procedure: EXPLORATORY LAPAROTOMY;  Surgeon: Anastasio Auerbach, MD;  Location: [facility];  Service: Gynecology;  Laterality: N/A;   MYOMECTOMY N/A 02/11/2016   Procedure: MYOMECTOMY;  Surgeon: Anastasio Auerbach, MD;  Location: [facility];  Service: Gynecology;  Laterality: N/A;   Patient Active Problem List   Diagnosis Date Noted   Anemia    Leiomyoma 02/11/2016    PCP: Fanny Bien, MD  REFERRING PROVIDER: Fanny Bien, MD  REFERRING DIAG: scoliosis  Rationale for Evaluation and Treatment Rehabilitation  THERAPY DIAG:  Pain in thoracic spine  Muscle weakness (generalized)  Abnormal posture  ONSET DATE: chronic back pain, fluctuating several years  SUBJECTIVE:                                                                                                                                                                                           SUBJECTIVE STATEMENT: Pt reports this episode similar to previous episode  for which she received PT with excellent relief. Has since been managing symptoms through exercise (yoga, running up to 3 miles) but has had job changes recently that have made her schedule busier and she has not been able to exercise as much. Notes that onset of symptoms seems to have coincided with reduction in exercise. Denies saddle anesthesia or changes in bowel/bladder - mentions some posterior L hip numbness on occasion but no other neuro  symptoms. Reports difficulty with some work activities such as pushing and pulling, difficulty sleeping (waking d/t pain, improves with position change), difficulty picking up objects from floor.   PERTINENT HISTORY:  GERD  PAIN:  Are you having pain: none currently Location: Mid back, L side/center How would you describe your pain? Difficulty describing Best in past week: 0/10 Worst in past week: 8/10 Aggravating factors: sleeping, bending, driving, pick things up from floor Easing factors: ice, stretching, movement, medications   PRECAUTIONS: None  WEIGHT BEARING RESTRICTIONS No  FALLS:  Has patient fallen in last 6 months? 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  PLOF: Independent  PATIENT GOALS get less pain, get stronger   OBJECTIVE:   DIAGNOSTIC FINDINGS:  None recently  PATIENT SURVEYS:  FOTO 66  SCREENING FOR RED FLAGS: Denies bowel/bladder symptoms, denies saddle anesthesia, etc  COGNITION:  Overall cognitive status: Within functional limits for tasks assessed     SENSATION: Light touch intact throughout all extremities, questionable diminishing on L arm and medial thigh, somewhat inconsistent with testing - no other gross neuro concerns noted, will monitor as indicated   POSTURE: Rounded shoulders, R  shoulder elevation compared to L, increased kyphosis  PALPATION: Nontender tightness B thoracolumbar paraspinals, low traps, and superior aspect of QL. Pt reports excellent relief with minimal STM  LUMBAR ROM:   Active  A/PROM  eval  Flexion 100%, + relief  Extension 75% + relief  Right lateral flexion 75%  Left lateral flexion 100%  Right rotation 100%  Left rotation 100%   (Blank rows = not tested)   LOWER EXTREMITY MMT:    MMT Right eval Left eval  Hip flexion 4- 4-  Hip abduction (modified sitting) 5 5  Hip internal rotation 4 4-  Hip external rotation 4 4  Knee flexion 5 5  Knee extension 5 5   (Blank rows = not tested)  Comments: nonpainful hip weakness more noted in L   FUNCTIONAL TESTS:  5xSTS: 9 sec standard chair no UE, mechanics WNL  Squat: mild R knee discomfort, anterior tibial translation B, reduced ankle DF noted B, mild hip shift to R  Adams fwd bend test: rise on R, dip on L   GAIT: Distance walked: within clinic Assistive device utilized: None Level of assistance: Complete Independence Comments: mechanics grossly WNL    TODAY'S TREATMENT  [facility] Adult PT Treatment:                                                DATE: 03/12/2022  Therapeutic Exercise: Cat/cow x10 Doorway sidebending/flexion towards R x5 with 10sec hold (performed L side only on eval)   PATIENT EDUCATION:  Education details: Pt education on PT impairments, prognosis, and POC. Rationale for interventions, safe/appropriate HEP performance Person educated: Patient Education method: Explanation, Demonstration, Tactile cues, Verbal cues, and Handouts Education comprehension: verbalized understanding, returned demonstration, verbal cues required, tactile cues required, and needs further education    HOME EXERCISE PROGRAM: Access Code: [email or web address] URL: [email or web address] Date: 03/13/2022 Prepared by: Enis Slipper  Exercises - Cat Cow  - 1 x daily - 7 x weekly  - 3 sets - 10 reps -  Standing Quadratus Lumborum Stretch with Doorway  - 1 x daily - 7 x weekly - 3 sets - 5 reps - 10sec hold  ASSESSMENT:  CLINICAL IMPRESSION: Pt is a 36 year old woman who arrives to PT evaluation on this date for chronic back pain in context of scoliosis. Pt reports increased pain/discomfort with daily/work activities. During today's session pt demonstrates reduction in thoracolumbar mobility and core/hip strength which are limiting ability to perform aforementioned activities. Pt reports excellent relief with HEP performance and minimal STM to thoracolumbar musculature as noted above.   Recommend skilled PT to address aforementioned deficits to improve functional independence/tolerance. Pt departs today's session in no acute distress, all voiced questions/concerns addressed appropriately from PT perspective.     OBJECTIVE IMPAIRMENTS decreased endurance, decreased mobility, decreased ROM, decreased strength, hypomobility, postural dysfunction, and pain.   ACTIVITY LIMITATIONS carrying, lifting, bending, and sleeping  PARTICIPATION LIMITATIONS: driving, community activity, and occupation  PERSONAL FACTORS Time since onset of injury/illness/exacerbation are also affecting patient's functional outcome.   REHAB POTENTIAL: Good  CLINICAL DECISION MAKING: Stable/uncomplicated  EVALUATION COMPLEXITY: Low   GOALS: Goals reviewed with patient? No  SHORT TERM GOALS: Target date: 04/03/2022  Pt will demonstrate appropriate understanding and performance of initially prescribed HEP in order to facilitate improved independence with management of symptoms.  Baseline: HEP provided on eval Goal status: INITIAL   2. Pt will score greater than or equal to 70 on FOTO in order to demonstrate improved perception of function due to symptoms.  Baseline: 66  Goal status: INITIAL   LONG TERM GOALS: Target date: 04/24/2022  Pt will score 74 on FOTO in order to demonstrate improved  perception of functional status due to symptoms.  Baseline: 66 Goal status: INITIAL  2. Pt will demonstrate/report ability to pick up object up to 5# from floor in order to indicate improved tolerance to functional activities.  Baseline: pain with picking up light object off floor Goal status: INITIAL  3.  Pt will demonstrate hip ER/IR MMT of 4+/5 in order to demonstrate improved strength for functional movements such as bending/squatting. Baseline: 4/5 R hip ER/IR, L IR 4-/5 ER 4/5 Goal status: INITIAL  4. Pt will demonstrate symmetrical lateral trunk flexion for improved kinematics and comfort with functional movements. Baseline: 75% lateral flexion to R, 100% to L  Goal status: INITIAL    PLAN: PT FREQUENCY: 1x/week  PT DURATION: 6 weeks  PLANNED INTERVENTIONS: Therapeutic exercises, Therapeutic activity, Neuromuscular re-education, Balance training, Gait training, Patient/Family education, Self Care, Joint mobilization, Aquatic Therapy, Dry Needling, Spinal mobilization, Cryotherapy, Moist heat, Manual therapy, and Re-evaluation.  PLAN FOR NEXT SESSION: Review/update HEP as appropriate. Progress mobility exercises (pt voices strong interest in stretching/yoga) and introduce strengthening exercises for core/hip musculature    Leeroy Cha PT, DPT 03/13/2022 1:25 PM

## 2022-03-13 ENCOUNTER — Ambulatory Visit: Payer: 59 | Attending: Family Medicine | Admitting: Physical Therapy

## 2022-03-13 ENCOUNTER — Other Ambulatory Visit: Payer: Self-pay

## 2022-03-13 ENCOUNTER — Encounter: Payer: Self-pay | Admitting: Physical Therapy

## 2022-03-13 DIAGNOSIS — R293 Abnormal posture: Secondary | ICD-10-CM | POA: Insufficient documentation

## 2022-03-13 DIAGNOSIS — M546 Pain in thoracic spine: Secondary | ICD-10-CM | POA: Diagnosis present

## 2022-03-13 DIAGNOSIS — M6281 Muscle weakness (generalized): Secondary | ICD-10-CM | POA: Diagnosis present

## 2022-03-21 ENCOUNTER — Ambulatory Visit: Payer: 59 | Attending: Family Medicine | Admitting: Physical Therapy

## 2022-03-21 ENCOUNTER — Encounter: Payer: Self-pay | Admitting: Physical Therapy

## 2022-03-21 DIAGNOSIS — M546 Pain in thoracic spine: Secondary | ICD-10-CM | POA: Diagnosis present

## 2022-03-21 DIAGNOSIS — R293 Abnormal posture: Secondary | ICD-10-CM | POA: Diagnosis present

## 2022-03-21 DIAGNOSIS — M6281 Muscle weakness (generalized): Secondary | ICD-10-CM | POA: Diagnosis present

## 2022-03-21 NOTE — Therapy (Signed)
OUTPATIENT PHYSICAL THERAPY TREATMENT NOTE   Patient Name: Catherine [name] MRN: [id number] DOB:05-10-1986, 36 y.o., female Today's Date: 03/21/2022  PCP: Fanny Bien, MD   REFERRING PROVIDER: Fanny Bien, MD   PT End of Session - 03/21/22 1111     Visit Number 2    Number of Visits 7    Date for PT Re-Evaluation 05/08/22    Authorization Type UHC    Authorization Time Period no auth required, FOTO v6/10    Progress Note Due on Visit 10    PT Start Time 1111    PT Stop Time 1152    PT Time Calculation (min) 41 min    Activity Tolerance Patient tolerated treatment well    Behavior During Therapy [facility] for tasks assessed/performed             Past Medical History:  Diagnosis Date   Allergy    Seasonal   Anemia    Anemia 2018   Elevated cholesterol    GERD (gastroesophageal reflux disease)    with spicy food - no med   Headache    Kidney stone    Prediabetes    Scoliosis    Ulcer, stomach peptic    Past Surgical History:  Procedure Laterality Date   LAPAROTOMY N/A 02/11/2016   Procedure: EXPLORATORY LAPAROTOMY;  Surgeon: Anastasio Auerbach, MD;  Location: [facility];  Service: Gynecology;  Laterality: N/A;   MYOMECTOMY N/A 02/11/2016   Procedure: MYOMECTOMY;  Surgeon: Anastasio Auerbach, MD;  Location: [facility];  Service: Gynecology;  Laterality: N/A;   Patient Active Problem List   Diagnosis Date Noted   Anemia    Leiomyoma 02/11/2016    THERAPY DIAG:  Pain in thoracic spine  Muscle weakness (generalized)  Abnormal posture  REFERRING DIAG: scoliosis  PERTINENT HISTORY: GERD  PRECAUTIONS/RESTRICTIONS:   none  SUBJECTIVE:  Pt reports that the HEP exercises are helpful, but she continues to have pain.  She rates her pain currently @ 7/10  PAIN:  Are you having pain: none currently Location: Mid back, L side/center How would you describe your pain? Difficulty describing Best in past week: 0/10 Worst in past week: 8/10 Aggravating  factors: sleeping, bending, driving, pick things up from floor Easing factors: ice, stretching, movement, medications  OBJECTIVE: (objective measures completed at initial evaluation unless otherwise dated)  DIAGNOSTIC FINDINGS:  None recently   PATIENT SURVEYS:  FOTO 66   SCREENING FOR RED FLAGS: Denies bowel/bladder symptoms, denies saddle anesthesia, etc   COGNITION:           Overall cognitive status: Within functional limits for tasks assessed                          SENSATION: Light touch intact throughout all extremities, questionable diminishing on L arm and medial thigh, somewhat inconsistent with testing - no other gross neuro concerns noted, will monitor as indicated     POSTURE: Rounded shoulders, R shoulder elevation compared to L, increased kyphosis   PALPATION: Nontender tightness B thoracolumbar paraspinals, low traps, and superior aspect of QL. Pt reports excellent relief with minimal STM   LUMBAR ROM:    Active  A/PROM  eval  Flexion 100%, + relief  Extension 75% + relief  Right lateral flexion 75%  Left lateral flexion 100%  Right rotation 100%  Left rotation 100%   (Blank rows = not tested)     LOWER EXTREMITY MMT:  MMT Right eval Left eval  Hip flexion 4- 4-  Hip abduction (modified sitting) 5 5  Hip internal rotation 4 4-  Hip external rotation 4 4  Knee flexion 5 5  Knee extension 5 5   (Blank rows = not tested)   Comments: nonpainful hip weakness more noted in L     FUNCTIONAL TESTS:  5xSTS: 9 sec standard chair no UE, mechanics WNL   Squat: mild R knee discomfort, anterior tibial translation B, reduced ankle DF noted B, mild hip shift to R   Adams fwd bend test: rise on R, dip on L     GAIT: Distance walked: within clinic Assistive device utilized: None Level of assistance: Complete Independence Comments: mechanics grossly WNL     HOME EXERCISE PROGRAM: Access Code: [email or web address] URL:  [email or web address] Date: 03/13/2022 Prepared by: Enis Slipper   Exercises - Cat Cow  - 1 x daily - 7 x weekly - 3 sets - 10 reps - Standing Quadratus Lumborum Stretch with Doorway  - 1 x daily - 7 x weekly - 3 sets - 5 reps - 10sec hold   TREATMENT 10/7:  Therapeutic Exercise: - nu-step L6 28mwhile taking subjective and planning session with patient - open book - 15x ea - LE x-over - 10x ea - Fig 4 bridge - 2x10 ea - dead bug (hard version) - 3x10 - Cat camel - 15x - bird dog - 2x10 - L OH press (8#) with R rotation (10# cable) - prayer stretch -> cobra 10x - Side plank from knees with clam - blue - 2x10 ea  Manual Therapy: - L QL stretch on mat with rotation - UPA and CPA lower thoracic spine - G III - IV  ASSESSMENT:   CLINICAL IMPRESSION: Sincerity tolerated session well with no adverse reaction.  Concentrated on combination of thoracic and lumbar mobility in combination with core/hip/periscapular strengthening.  Will continue to progress as able.    Responded well to manual therapy.  HEP updated.     OBJECTIVE IMPAIRMENTS decreased endurance, decreased mobility, decreased ROM, decreased strength, hypomobility, postural dysfunction, and pain.    ACTIVITY LIMITATIONS carrying, lifting, bending, and sleeping   PARTICIPATION LIMITATIONS: driving, community activity, and occupation   PERSONAL FACTORS Time since onset of injury/illness/exacerbation are also affecting patient's functional outcome.    REHAB POTENTIAL: Good   CLINICAL DECISION MAKING: Stable/uncomplicated   EVALUATION COMPLEXITY: Low     GOALS: Goals reviewed with patient? No   SHORT TERM GOALS: Target date: 04/03/2022   Pt will demonstrate appropriate understanding and performance of initially prescribed HEP in order to facilitate improved independence with management of symptoms.  Baseline: HEP provided on eval Goal status: INITIAL    2. Pt will score greater than or equal to 70  on FOTO in order to demonstrate improved perception of function due to symptoms.            Baseline: 66            Goal status: INITIAL    LONG TERM GOALS: Target date: 04/24/2022   Pt will score 74 on FOTO in order to demonstrate improved perception of functional status due to symptoms.  Baseline: 66 Goal status: INITIAL   2. Pt will demonstrate/report ability to pick up object up to 5# from floor in order to indicate improved tolerance to functional activities.  Baseline: pain with picking up light object off floor Goal status: INITIAL   3.  Pt will demonstrate hip ER/IR  MMT of 4+/5 in order to demonstrate improved strength for functional movements such as bending/squatting. Baseline: 4/5 R hip ER/IR, L IR 4-/5 ER 4/5 Goal status: INITIAL   4. Pt will demonstrate symmetrical lateral trunk flexion for improved kinematics and comfort with functional movements. Baseline: 75% lateral flexion to R, 100% to L            Goal status: INITIAL      PLAN: PT FREQUENCY: 1x/week   PT DURATION: 6 weeks   PLANNED INTERVENTIONS: Therapeutic exercises, Therapeutic activity, Neuromuscular re-education, Balance training, Gait training, Patient/Family education, Self Care, Joint mobilization, Aquatic Therapy, Dry Needling, Spinal mobilization, Cryotherapy, Moist heat, Manual therapy, and Re-evaluation.   PLAN FOR NEXT SESSION: Review/update HEP as appropriate. Progress mobility exercises (pt voices strong interest in stretching/yoga) and introduce strengthening exercises for core/hip musculature    Kevan Ny Tajai Suder PT 03/21/2022, 11:54 AM

## 2022-03-28 ENCOUNTER — Emergency Department (HOSPITAL_BASED_OUTPATIENT_CLINIC_OR_DEPARTMENT_OTHER)
Admission: EM | Admit: 2022-03-28 | Discharge: 2022-03-28 | Disposition: A | Discharge status: Home / Self Care | Payer: 59 | Attending: Emergency Medicine | Admitting: Emergency Medicine

## 2022-03-28 ENCOUNTER — Encounter (HOSPITAL_BASED_OUTPATIENT_CLINIC_OR_DEPARTMENT_OTHER): Payer: Self-pay | Admitting: Emergency Medicine

## 2022-03-28 ENCOUNTER — Other Ambulatory Visit: Payer: Self-pay

## 2022-03-28 ENCOUNTER — Emergency Department (HOSPITAL_BASED_OUTPATIENT_CLINIC_OR_DEPARTMENT_OTHER): Payer: 59 | Admitting: Radiology

## 2022-03-28 DIAGNOSIS — R0789 Other chest pain: Secondary | ICD-10-CM

## 2022-03-28 DIAGNOSIS — R079 Chest pain, unspecified: Secondary | ICD-10-CM | POA: Diagnosis present

## 2022-03-28 LAB — CBC WITH DIFFERENTIAL/PLATELET
Abs Immature Granulocytes: 0.02 10*3/uL (ref 0.00–0.07)
Basophils Absolute: 0 10*3/uL (ref 0.0–0.1)
Basophils Relative: 1 %
Eosinophils Absolute: 0.1 10*3/uL (ref 0.0–0.5)
Eosinophils Relative: 2 %
HCT: 37.9 % (ref 36.0–46.0)
Hemoglobin: 12.1 g/dL (ref 12.0–15.0)
Immature Granulocytes: 0 %
Lymphocytes Relative: 50 %
Lymphs Abs: 3.1 10*3/uL (ref 0.7–4.0)
MCH: 26.6 pg (ref 26.0–34.0)
MCHC: 31.9 g/dL (ref 30.0–36.0)
MCV: 83.3 fL (ref 80.0–100.0)
Monocytes Absolute: 0.6 10*3/uL (ref 0.1–1.0)
Monocytes Relative: 9 %
Neutro Abs: 2.3 10*3/uL (ref 1.7–7.7)
Neutrophils Relative %: 38 %
Platelets: 173 10*3/uL (ref 150–400)
RBC: 4.55 MIL/uL (ref 3.87–5.11)
RDW: 14.3 % (ref 11.5–15.5)
WBC: 6.2 10*3/uL (ref 4.0–10.5)
nRBC: 0 % (ref 0.0–0.2)

## 2022-03-28 LAB — COMPREHENSIVE METABOLIC PANEL
ALT: 17 U/L (ref 0–44)
AST: 17 U/L (ref 15–41)
Albumin: 4.1 g/dL (ref 3.5–5.0)
Alkaline Phosphatase: 36 U/L — ABNORMAL LOW (ref 38–126)
Anion gap: 10 (ref 5–15)
BUN: 15 mg/dL (ref 6–20)
CO2: 22 mmol/L (ref 22–32)
Calcium: 8.9 mg/dL (ref 8.9–10.3)
Chloride: 107 mmol/L (ref 98–111)
Creatinine, Ser: 0.78 mg/dL (ref 0.44–1.00)
GFR, Estimated: >60 mL/min (ref >60)
Glucose, Bld: 107 mg/dL — ABNORMAL HIGH (ref 70–99)
Potassium: 4 mmol/L (ref 3.5–5.1)
Sodium: 139 mmol/L (ref 135–145)
Total Bilirubin: 0.3 mg/dL (ref 0.3–1.2)
Total Protein: 7.6 g/dL (ref 6.5–8.1)

## 2022-03-28 LAB — TROPONIN I (HIGH SENSITIVITY): Troponin I (High Sensitivity): <2 ng/L (ref <18)

## 2022-03-28 LAB — PREGNANCY, URINE: Preg Test, Ur: NEGATIVE

## 2022-03-28 LAB — D-DIMER, QUANTITATIVE: D-Dimer, Quant: 0.46 ug/mL-FEU (ref 0.00–0.50)

## 2022-03-28 LAB — LIPASE, BLOOD: Lipase: 39 U/L (ref 11–51)

## 2022-03-28 MED ORDER — ACETAMINOPHEN 500 MG PO TABS
1000.0000 mg | ORAL_TABLET | Freq: Once | ORAL | Status: AC
  Administered 2022-03-28: 1000 mg via ORAL
  Filled 2022-03-28: qty 2

## 2022-03-28 MED ORDER — KETOROLAC TROMETHAMINE 15 MG/ML IJ SOLN
15.0000 mg | Freq: Once | INTRAMUSCULAR | Status: AC
  Administered 2022-03-28: 15 mg via INTRAVENOUS
  Filled 2022-03-28: qty 1

## 2022-03-28 NOTE — Discharge Instructions (Addendum)
We evaluated you in the emergency department for your chest pain.  Your lab tests and chest x-ray were reassuring.  Your pain is likely due to pain in the muscles and ligaments of your chest.  Please take Tylenol and Motrin for your symptoms at home.  You can take 650 mg of Tylenol every 6 hours and 600 mg of ibuprofen every 6 hours as needed for your symptoms.  You can take these medicines together as needed, either at the same time, or alternating every 3 hours.  Please return if you develop any new or worsening symptoms such as difficulty breathing, severe pain, fainting, vomiting, fevers or chills, cough, or any other concerning symptoms.

## 2022-03-28 NOTE — ED Provider Notes (Signed)
Emergency Department Provider Note   I have reviewed the triage vital signs and the nursing notes.   HISTORY  Chief Complaint Chest Pain   HPI Catherine [name] is a 36 y.o. female past history reviewed below presents emergency department with acute onset center left chest discomfort starting yesterday.  She has had constant, sharp pain without radiation.  Pain is worse with movement but also deep breathing.  She is not feeling particularly short of breath.  No hemoptysis.  No leg swelling or pain.  No recent trips or surgeries.  She is on oral contraception medication.  No prior history of VTE. No cardiac history.     Past Medical History:  Diagnosis Date   Allergy    Seasonal   Anemia    Anemia 2018   Elevated cholesterol    GERD (gastroesophageal reflux disease)    with spicy food - no med   Headache    Kidney stone    Prediabetes    Scoliosis    Ulcer, stomach peptic     Review of Systems  Constitutional: No fever/chills Cardiovascular: Positive chest pain. Respiratory: Denies shortness of breath. Gastrointestinal: No abdominal pain.  No nausea, no vomiting.  No diarrhea.  No constipation. Musculoskeletal: Negative for back pain. Skin: Negative for rash. Neurological: Negative for headache.   ____________________________________________   PHYSICAL EXAM:  VITAL SIGNS: ED Triage Vitals  Enc Vitals Group     BP 03/28/22 0622 118/78     Pulse Rate 03/28/22 0622 73     Resp 03/28/22 0622 16     Temp 03/28/22 0622 98.7 F (37.1 C)     Temp Source 03/28/22 0622 Oral     SpO2 03/28/22 0622 100 %     Weight 03/28/22 0623 190 lb (86.2 kg)   Constitutional: Alert and oriented. Well appearing and in no acute distress. Eyes: Conjunctivae are normal.  Head: Atraumatic. Nose: No congestion/rhinnorhea. Mouth/Throat: Mucous membranes are moist.  Neck: No stridor.   Cardiovascular: Normal rate, regular rhythm. Good peripheral circulation. Grossly normal  heart sounds.   Respiratory: Normal respiratory effort.  No retractions. Lungs CTAB. Gastrointestinal: Soft and nontender. No distention.  Musculoskeletal: No lower extremity tenderness nor edema. No gross deformities of extremities.  Tenderness to palpation over the left sternal border causing patient to wince and withdrawal.  Neurologic:  Normal speech and language.  Skin:  Skin is warm, dry and intact. No rash noted.  ____________________________________________   LABS (all labs ordered are listed, but only abnormal results are displayed)  Labs Reviewed  COMPREHENSIVE METABOLIC PANEL - Abnormal; Notable for the following components:      Result Value   Glucose, Bld 107 (*)    Alkaline Phosphatase 36 (*)    All other components within normal limits  LIPASE, BLOOD  CBC WITH DIFFERENTIAL/PLATELET  D-DIMER, QUANTITATIVE  PREGNANCY, URINE  TROPONIN I (HIGH SENSITIVITY)   ____________________________________________  EKG   EKG Interpretation  Date/Time:  Saturday March 28 2022 06:21:20 EDT Ventricular Rate:  72 PR Interval:  169 QRS Duration: 81 QT Interval:  412 QTC Calculation: 451 R Axis:   25 Text Interpretation: Sinus rhythm Abnormal R-wave progression, early transition Left ventricular hypertrophy Nonspecific ST changes No prior for comparison Confirmed by Nanda Quinton [phone]) on 03/28/2022 6:28:16 AM        ____________________________________________  RADIOLOGY  DG Chest 2 View  Result Date: 03/28/2022 CLINICAL DATA:  36 year old female with history of left-sided chest pain. Cough. EXAM: CHEST -  2 VIEW COMPARISON:  Chest x-ray 12/15/2012. FINDINGS: Lung volumes are low. No consolidative airspace disease. No pleural effusions. No pneumothorax. No pulmonary nodule or mass noted. Pulmonary vasculature and the cardiomediastinal silhouette are within normal limits. IMPRESSION: 1. Low lung volumes without radiographic evidence of acute cardiopulmonary disease.  Electronically Signed   By: Vinnie Langton M.D.   On: 03/28/2022 06:51    ____________________________________________   PROCEDURES  Procedure(s) performed:   Procedures  None ____________________________________________   INITIAL IMPRESSION / ASSESSMENT AND PLAN / ED COURSE  Pertinent labs & imaging results that were available during my care of the patient were reviewed by me and considered in my medical decision making (see chart for details).   This patient is Presenting for Evaluation of CP, which does require a range of treatment options, and is a complaint that involves a high risk of morbidity and mortality.  The Differential Diagnoses includes but is not exclusive to acute coronary syndrome, aortic dissection, pulmonary embolism, cardiac tamponade, community-acquired pneumonia, pericarditis, musculoskeletal chest wall pain, etc.    Clinical Laboratory Tests Ordered, included d dimer which is negative, troponin WNL, and no AKI.   Radiologic Tests Ordered, included CXR. I independently interpreted the images and agree with radiology interpretation.   Cardiac Monitor Tracing which shows NSR.    Social Determinants of Health Risk patient is a non-smoker.   Medical Decision Making: Summary:  Patient presents to the ED with constant sharp CP since yesterday. Vitals are WNL. She is on OCPs. Patient with reproducible pain on exam but patient states this is not the pain she is experiencing. Plan for labs, CXR, and d dimer given OCP use and sharp pain.   Care transferred to Dr. Truett Mainland pending CXR and reaming labs. Favor MSK etiology.    Disposition: pending   ____________________________________________  FINAL CLINICAL IMPRESSION(S) / ED DIAGNOSES  Final diagnoses:  Chest wall pain     Note:  This document was prepared using Dragon voice recognition software and may include unintentional dictation errors.  Nanda Quinton, MD, [facility] Emergency Medicine    Kallista Pae,  [facility], MD 03/29/22 [phone]

## 2022-03-28 NOTE — ED Triage Notes (Signed)
Presents from home for CP starting yesterday (L chest, 10/10, stabbing). Associated with nausea, started at rest.  Denies fever, emesis, SOB, dizziness.  Nonsmoker, no daily meds.

## 2022-03-28 NOTE — ED Provider Notes (Signed)
  Physical Exam  BP (!) 116/90   Pulse 74   Temp 98.7 F (37.1 C) (Oral)   Resp (!) 25   Wt 86.2 kg   LMP 03/07/2022 (Approximate)   SpO2 100%   BMI 32.61 kg/m   Physical Exam  Procedures  Procedures  ED Course / MDM   Clinical Course as of 03/28/22 0906  Sat Mar 28, 2022  0707 Received sign out pending labs including D-dimer for pleuritic/reproducible chest pain [WS]  0905 D-dimer negative.  Troponin negative.  Reassessed patient, she reports her symptoms have improved.  Urine pregnancy negative so will give Toradol.  Discussed pain control at home.  Suspect diagnosis is chest wall pain. Will discharge patient to home. All questions answered. Patient comfortable with plan of discharge. Return precautions discussed with patient and specified on the after visit summary.  [WS]    Clinical Course User Index [WS] Cristie Hem, MD   Medical Decision Making Problems Addressed: Chest wall pain: acute illness or injury  Amount and/or Complexity of Data Reviewed Labs: ordered. Decision-making details documented in ED Course. Radiology: ordered. Decision-making details documented in ED Course.  Risk OTC drugs. Prescription drug management.        Cristie Hem, MD 03/28/22 [phone]

## 2022-04-04 ENCOUNTER — Ambulatory Visit: Payer: 59 | Admitting: Physical Therapy

## 2022-04-11 ENCOUNTER — Encounter: Payer: 59 | Admitting: Physical Therapy

## 2022-04-30 ENCOUNTER — Encounter: Payer: 59 | Admitting: Physical Therapy

## 2022-05-01 ENCOUNTER — Ambulatory Visit: Payer: 59 | Admitting: Physical Therapy

## 2022-08-06 ENCOUNTER — Other Ambulatory Visit: Payer: Self-pay | Admitting: Family Medicine

## 2022-08-06 DIAGNOSIS — K824 Cholesterolosis of gallbladder: Secondary | ICD-10-CM

## 2022-09-11 ENCOUNTER — Other Ambulatory Visit: Payer: Self-pay | Admitting: Family Medicine

## 2022-09-11 ENCOUNTER — Ambulatory Visit
Admission: RE | Admit: 2022-09-11 | Discharge: 2022-09-11 | Disposition: A | Discharge status: Home / Self Care | Payer: 59 | Source: Ambulatory Visit | Attending: Family Medicine | Admitting: Family Medicine

## 2022-09-11 DIAGNOSIS — K824 Cholesterolosis of gallbladder: Secondary | ICD-10-CM

## 2022-10-09 ENCOUNTER — Other Ambulatory Visit: Payer: 59

## 2022-11-08 ENCOUNTER — Ambulatory Visit
Admission: EM | Admit: 2022-11-08 | Discharge: 2022-11-08 | Disposition: A | Discharge status: Home / Self Care | Payer: 59 | Attending: Urgent Care | Admitting: Urgent Care

## 2022-11-08 DIAGNOSIS — J309 Allergic rhinitis, unspecified: Secondary | ICD-10-CM

## 2022-11-08 DIAGNOSIS — J0110 Acute frontal sinusitis, unspecified: Secondary | ICD-10-CM

## 2022-11-08 LAB — POCT URINALYSIS DIP (MANUAL ENTRY)
Bilirubin, UA: NEGATIVE
Glucose, UA: NEGATIVE mg/dL
Ketones, POC UA: NEGATIVE mg/dL
Leukocytes, UA: NEGATIVE
Nitrite, UA: NEGATIVE
Protein Ur, POC: NEGATIVE mg/dL
Spec Grav, UA: 1.015 (ref 1.010–1.025)
Urobilinogen, UA: 0.2 E.U./dL
pH, UA: 6.5 (ref 5.0–8.0)

## 2022-11-08 LAB — POCT URINE PREGNANCY: Preg Test, Ur: NEGATIVE

## 2022-11-08 MED ORDER — CETIRIZINE HCL 10 MG PO TABS: 10.0000 mg | ORAL_TABLET | Freq: Every day | ORAL | 0 refills | Status: AC

## 2022-11-08 MED ORDER — ACETAMINOPHEN 325 MG PO TABS
650.0000 mg | ORAL_TABLET | Freq: Once | ORAL | Status: AC
  Administered 2022-11-08: 650 mg via ORAL

## 2022-11-08 MED ORDER — PSEUDOEPHEDRINE HCL 60 MG PO TABS: 60.0000 mg | ORAL_TABLET | Freq: Three times a day (TID) | ORAL | 0 refills | Status: AC | PRN

## 2022-11-08 MED ORDER — AMOXICILLIN 875 MG PO TABS: 875.0000 mg | ORAL_TABLET | Freq: Two times a day (BID) | ORAL | 0 refills | Status: AC

## 2022-11-08 NOTE — ED Provider Notes (Signed)
[facility]  Note:  This document was prepared using Conservation officer, historic buildings and may include unintentional dictation errors.  MRN: [id number] DOB: 1985-09-12  Subjective:   Catherine [name] is a 37 y.o. female presenting for 2-week history of persistent intermittent chills, fatigue, malaise, heaviness around both of her eyes, frontal headaches more prominently at night.  Has also developed urinary frequency.  No cough, chest pain, shortness of breath or wheezing, neck stiffness, nausea, vomiting, abdominal pain, dysuria, hematuria, vaginal discharge.  Patient was seen at a different clinic when she was away on vacation.  Presents blood work that shows she had an elevated absolute neutrophil count, low white blood cell count, slightly elevated CRP.  All other testing was normal.  Has allergic rhinitis and takes Claritin for this.  No current facility-administered medications for this encounter.  Current Outpatient Medications:    Azelastine HCl 137 MCG/SPRAY SOLN, SMARTSIG:1-2 Spray(s) Both Nares 1-2 Times Daily, Disp: , Rfl:    norethindrone-ethinyl estradiol-FE (LOESTRIN FE) 1-20 MG-MCG tablet, Take 1 tablet by mouth daily., Disp: 84 tablet, Rfl: 3   omeprazole (PRILOSEC) 20 MG capsule, Take 20 mg by mouth every morning., Disp: , Rfl:    rosuvastatin (CRESTOR) 5 MG tablet, Take 5 mg by mouth daily., Disp: , Rfl:    Vitamin D, Ergocalciferol, (DRISDOL) 1.25 MG (50000 UNIT) CAPS capsule, Take 50,000 Units by mouth once a week., Disp: , Rfl:    WEGOVY 0.25 MG/0.5ML SOAJ, Inject into the skin., Disp: , Rfl:    No Known Allergies  Past Medical History:  Diagnosis Date   Allergy    Seasonal   Anemia    Anemia 2018   Elevated cholesterol    GERD (gastroesophageal reflux disease)    with spicy food - no med   Headache    Kidney stone    Prediabetes    Scoliosis    Ulcer, stomach peptic      Past Surgical History:  Procedure Laterality Date    LAPAROTOMY N/A 02/11/2016   Procedure: EXPLORATORY LAPAROTOMY;  Surgeon: Dara Lords, MD;  Location: [facility];  Service: Gynecology;  Laterality: N/A;   MYOMECTOMY N/A 02/11/2016   Procedure: MYOMECTOMY;  Surgeon: Dara Lords, MD;  Location: [facility];  Service: Gynecology;  Laterality: N/A;    Family History  Problem Relation Age of Onset   Hypertension Mother     Social History   Tobacco Use   Smoking status: Never   Smokeless tobacco: Never  Vaping Use   Vaping Use: Never used  Substance Use Topics   Alcohol use: Yes    Comment: Occas.   Drug use: Never    ROS   Objective:   Vitals: BP 107/73 (BP Location: Right Arm)   Pulse 98   Temp (!) 100.7 F (38.2 C) (Oral)   Resp 16   LMP  (Within Weeks) Comment: 1 week.  SpO2 96%   Physical Exam Constitutional:      General: She is not in acute distress.    Appearance: Normal appearance. She is well-developed and normal weight. She is not ill-appearing, toxic-appearing or diaphoretic.  HENT:     Head: Normocephalic and atraumatic.     Right Ear: Tympanic membrane, ear canal and external ear normal. No drainage or tenderness. No middle ear effusion. There is no impacted cerumen. Tympanic membrane is not erythematous or bulging.     Left Ear: Tympanic membrane, ear canal and external ear normal. No drainage  or tenderness.  No middle ear effusion. There is no impacted cerumen. Tympanic membrane is not erythematous or bulging.     Nose: Congestion present. No rhinorrhea.     Mouth/Throat:     Mouth: Mucous membranes are moist. No oral lesions.     Pharynx: No pharyngeal swelling, oropharyngeal exudate, posterior oropharyngeal erythema or uvula swelling.     Tonsils: No tonsillar exudate or tonsillar abscesses.  Eyes:     General: No scleral icterus.       Right eye: No discharge.        Left eye: No discharge.     Extraocular Movements: Extraocular movements intact.     Right eye: Normal extraocular motion.      Left eye: Normal extraocular motion.     Conjunctiva/sclera: Conjunctivae normal.  Cardiovascular:     Rate and Rhythm: Normal rate and regular rhythm.     Heart sounds: Normal heart sounds. No murmur heard.    No friction rub. No gallop.  Pulmonary:     Effort: Pulmonary effort is normal. No respiratory distress.     Breath sounds: No stridor. No wheezing, rhonchi or rales.  Chest:     Chest wall: No tenderness.  Musculoskeletal:     Cervical back: Normal range of motion and neck supple.  Lymphadenopathy:     Cervical: No cervical adenopathy.  Skin:    General: Skin is warm and dry.  Neurological:     General: No focal deficit present.     Mental Status: She is alert and oriented to person, place, and time.     Cranial Nerves: No cranial nerve deficit.     Motor: No weakness.     Coordination: Coordination normal.     Gait: Gait normal.     Comments: Negative Kernig and Brudzinski.  Psychiatric:        Mood and Affect: Mood normal.        Behavior: Behavior normal.        Thought Content: Thought content normal.        Judgment: Judgment normal.     Results for orders placed or performed during the hospital encounter of 11/08/22 (from the past 24 hour(s))  POCT urinalysis dipstick     Status: Abnormal   Collection Time: 11/08/22  1:44 PM  Result Value Ref Range   Color, UA yellow yellow   Clarity, UA clear clear   Glucose, UA negative negative mg/dL   Bilirubin, UA negative negative   Ketones, POC UA negative negative mg/dL   Spec Grav, UA 1.610 9.604 - 1.025   Blood, UA trace-intact (A) negative   pH, UA 6.5 5.0 - 8.0   Protein Ur, POC negative negative mg/dL   Urobilinogen, UA 0.2 0.2 or 1.0 E.U./dL   Nitrite, UA Negative Negative   Leukocytes, UA Negative Negative  POCT urine pregnancy     Status: None   Collection Time: 11/08/22  1:44 PM  Result Value Ref Range   Preg Test, Ur Negative Negative    Assessment and Plan :   PDMP not reviewed this  encounter.  1. Acute frontal sinusitis, recurrence not specified   2. Allergic rhinitis, unspecified seasonality, unspecified trigger    I reviewed her paperwork and blood test results from the clinic she was seen at in [place].  I reassured patient that I do not believe she has sepsis.  Will start empiric treatment for frontal sinusitis with amoxicillin.  Recommended supportive care otherwise.  No  signs of an acute encephalopathy.  Counseled patient on potential for adverse effects with medications prescribed/recommended today, ER and return-to-clinic precautions discussed, patient verbalized understanding.    Wallis Bamberg, [place] 11/08/22 1415

## 2022-11-08 NOTE — Discharge Instructions (Signed)
We will manage this as a sinus infection with amoxicillin. For sore throat or cough try using a honey-based tea. Use 3 teaspoons of honey with juice squeezed from half lemon. Place shaved pieces of ginger into 1/2-1 cup of water and warm over stove top. Then mix the ingredients and repeat every 4 hours as needed. Please take Tylenol 650mg  every 6 hours for throat pain, fevers, aches and pains. Hydrate very well with at least 2 liters of water. Eat light meals such as soups (chicken and noodles, vegetable, chicken and wild rice).  Do not eat foods that you are allergic to.  Taking an antihistamine like Zyrtec can help against postnasal drainage, sinus congestion which can cause sinus pain, sinus headaches, throat pain, painful swallowing, coughing.  You can take this together with pseudoephedrine (Sudafed) at a dose of 60 mg 3 times a day or twice daily as needed for the same kind of nasal drip, congestion.  Use cough medication as needed.

## 2022-11-08 NOTE — ED Triage Notes (Signed)
Pt reports fatigue, chills, eyes heaviness, on and off headache fever x 2 weeks; increase urinary frequency. Reports she had blood work done in [address] when she was on vacation and was told she has a blood infection. Pt reports she was sick before she went on vacation. Taking Tylenol

## 2023-02-16 ENCOUNTER — Other Ambulatory Visit (HOSPITAL_COMMUNITY): Payer: Self-pay

## 2023-02-16 MED ORDER — SEMAGLUTIDE-WEIGHT MANAGEMENT 0.25 MG/0.5ML ~~LOC~~ SOAJ
0.2500 mg | SUBCUTANEOUS | 0 refills | Status: AC
  Filled 2023-02-16: qty 2, 28d supply, fill #0

## 2023-03-09 ENCOUNTER — Other Ambulatory Visit (HOSPITAL_COMMUNITY): Payer: Self-pay

## 2023-03-09 MED ORDER — FAMOTIDINE 40 MG PO TABS
40.0000 mg | ORAL_TABLET | Freq: Every day | ORAL | 3 refills | Status: AC
  Filled 2023-03-09: qty 30, 30d supply, fill #0

## 2023-03-09 MED ORDER — WEGOVY 0.25 MG/0.5ML ~~LOC~~ SOAJ
0.2500 mg | SUBCUTANEOUS | 0 refills | Status: AC
  Filled 2023-03-09: qty 2, 28d supply, fill #0

## 2023-04-01 ENCOUNTER — Other Ambulatory Visit (HOSPITAL_COMMUNITY): Payer: Self-pay

## 2023-04-01 MED ORDER — WEGOVY 0.25 MG/0.5ML ~~LOC~~ SOAJ
0.2500 mg | SUBCUTANEOUS | 2 refills | Status: AC
  Filled 2023-04-01 – 2023-04-09 (×5): qty 2, 28d supply, fill #0
  Filled 2023-05-06 – 2023-07-13 (×2): qty 2, 28d supply, fill #1

## 2023-04-09 ENCOUNTER — Other Ambulatory Visit (HOSPITAL_COMMUNITY): Payer: Self-pay

## 2023-04-09 ENCOUNTER — Other Ambulatory Visit: Payer: Self-pay

## 2023-05-06 ENCOUNTER — Other Ambulatory Visit (HOSPITAL_COMMUNITY): Payer: Self-pay

## 2023-05-07 ENCOUNTER — Other Ambulatory Visit (HOSPITAL_COMMUNITY): Payer: Self-pay

## 2023-05-10 ENCOUNTER — Other Ambulatory Visit (HOSPITAL_COMMUNITY): Payer: Self-pay

## 2023-05-10 MED ORDER — IBUPROFEN 800 MG PO TABS
800.0000 mg | ORAL_TABLET | Freq: Three times a day (TID) | ORAL | 1 refills | Status: AC | PRN
  Filled 2023-05-10: qty 30, 10d supply, fill #0
  Filled 2023-07-15: qty 30, 10d supply, fill #1

## 2023-05-10 MED ORDER — WEGOVY 0.5 MG/0.5ML ~~LOC~~ SOAJ
0.5000 mg | SUBCUTANEOUS | 2 refills | Status: DC
  Filled 2023-05-10: qty 2, 28d supply, fill #0

## 2023-05-11 ENCOUNTER — Other Ambulatory Visit (HOSPITAL_COMMUNITY): Payer: Self-pay

## 2023-05-12 ENCOUNTER — Other Ambulatory Visit (HOSPITAL_COMMUNITY): Payer: Self-pay

## 2023-05-14 ENCOUNTER — Other Ambulatory Visit (HOSPITAL_COMMUNITY): Payer: Self-pay

## 2023-05-19 ENCOUNTER — Other Ambulatory Visit (HOSPITAL_COMMUNITY): Payer: Self-pay

## 2023-05-19 MED ORDER — WEGOVY 0.5 MG/0.5ML ~~LOC~~ SOAJ
0.5000 mg | SUBCUTANEOUS | 2 refills | Status: AC
  Filled 2023-05-19: qty 2, 28d supply, fill #0
  Filled 2023-06-03 – 2023-06-08 (×2): qty 2, 28d supply, fill #1
  Filled 2023-07-08 – 2023-07-12 (×2): qty 2, 28d supply, fill #2

## 2023-05-20 ENCOUNTER — Other Ambulatory Visit (HOSPITAL_COMMUNITY): Payer: Self-pay

## 2023-05-21 ENCOUNTER — Other Ambulatory Visit (HOSPITAL_COMMUNITY): Payer: Self-pay

## 2023-06-04 ENCOUNTER — Other Ambulatory Visit (HOSPITAL_COMMUNITY): Payer: Self-pay

## 2023-06-10 ENCOUNTER — Other Ambulatory Visit (HOSPITAL_COMMUNITY): Payer: Self-pay

## 2023-06-11 ENCOUNTER — Other Ambulatory Visit (HOSPITAL_COMMUNITY): Payer: Self-pay

## 2023-06-18 ENCOUNTER — Other Ambulatory Visit (HOSPITAL_COMMUNITY): Payer: Self-pay

## 2023-06-18 MED ORDER — WEGOVY 0.5 MG/0.5ML ~~LOC~~ SOAJ
0.5000 mg | SUBCUTANEOUS | 2 refills | Status: AC
  Filled 2023-06-18 – 2023-07-05 (×2): qty 2, 28d supply, fill #0
  Filled 2023-08-11 – 2023-08-16 (×4): qty 2, 28d supply, fill #1
  Filled 2023-09-20 – 2023-10-04 (×4): qty 2, 28d supply, fill #2

## 2023-06-18 MED ORDER — ROSUVASTATIN CALCIUM 5 MG PO TABS
5.0000 mg | ORAL_TABLET | Freq: Every evening | ORAL | 3 refills | Status: AC
  Filled 2023-06-18: qty 30, 30d supply, fill #0
  Filled 2023-08-11: qty 30, 30d supply, fill #1
  Filled 2023-09-20: qty 30, 30d supply, fill #2

## 2023-07-05 ENCOUNTER — Other Ambulatory Visit: Payer: Self-pay

## 2023-07-05 ENCOUNTER — Other Ambulatory Visit (HOSPITAL_COMMUNITY): Payer: Self-pay

## 2023-07-08 ENCOUNTER — Other Ambulatory Visit (HOSPITAL_COMMUNITY): Payer: Self-pay

## 2023-07-09 ENCOUNTER — Other Ambulatory Visit (HOSPITAL_COMMUNITY): Payer: Self-pay

## 2023-07-12 ENCOUNTER — Other Ambulatory Visit (HOSPITAL_COMMUNITY): Payer: Self-pay

## 2023-07-13 ENCOUNTER — Other Ambulatory Visit: Payer: Self-pay

## 2023-07-13 ENCOUNTER — Other Ambulatory Visit (HOSPITAL_COMMUNITY): Payer: Self-pay

## 2023-07-15 ENCOUNTER — Other Ambulatory Visit (HOSPITAL_COMMUNITY): Payer: Self-pay

## 2023-07-15 MED ORDER — OMEPRAZOLE 40 MG PO CPDR
40.0000 mg | DELAYED_RELEASE_CAPSULE | Freq: Every day | ORAL | 1 refills | Status: DC
  Filled 2023-07-15: qty 30, 30d supply, fill #0

## 2023-07-15 MED ORDER — WEGOVY 0.5 MG/0.5ML ~~LOC~~ SOAJ
0.5000 mg | SUBCUTANEOUS | 2 refills | Status: AC
  Filled 2023-07-15: qty 2, 28d supply, fill #0

## 2023-07-22 ENCOUNTER — Other Ambulatory Visit: Payer: Self-pay

## 2023-07-22 ENCOUNTER — Other Ambulatory Visit (HOSPITAL_COMMUNITY): Payer: Self-pay

## 2023-07-23 ENCOUNTER — Other Ambulatory Visit (HOSPITAL_COMMUNITY): Payer: Self-pay

## 2023-08-11 ENCOUNTER — Other Ambulatory Visit: Payer: Self-pay

## 2023-08-13 ENCOUNTER — Other Ambulatory Visit (HOSPITAL_COMMUNITY): Payer: Self-pay

## 2023-08-17 ENCOUNTER — Other Ambulatory Visit (HOSPITAL_COMMUNITY): Payer: Self-pay

## 2023-08-17 MED ORDER — WEGOVY 0.5 MG/0.5ML ~~LOC~~ SOAJ
0.5000 mg | SUBCUTANEOUS | 2 refills | Status: DC
  Filled 2023-08-17: qty 2, 28d supply, fill #0

## 2023-08-17 MED ORDER — DICLOFENAC SODIUM 1 % EX GEL
2.0000 g | Freq: Four times a day (QID) | CUTANEOUS | 11 refills | Status: AC
  Filled 2023-08-17: qty 100, 10d supply, fill #0

## 2023-09-21 ENCOUNTER — Other Ambulatory Visit (HOSPITAL_COMMUNITY): Payer: Self-pay

## 2023-09-24 ENCOUNTER — Other Ambulatory Visit (HOSPITAL_COMMUNITY): Payer: Self-pay

## 2023-10-01 ENCOUNTER — Other Ambulatory Visit (HOSPITAL_COMMUNITY): Payer: Self-pay

## 2023-10-04 ENCOUNTER — Other Ambulatory Visit (HOSPITAL_COMMUNITY): Payer: Self-pay

## 2023-10-07 ENCOUNTER — Other Ambulatory Visit (HOSPITAL_COMMUNITY): Payer: Self-pay

## 2023-10-07 MED ORDER — FAMOTIDINE 40 MG PO TABS
40.0000 mg | ORAL_TABLET | Freq: Every day | ORAL | 3 refills | Status: AC
  Filled 2023-10-07: qty 30, 30d supply, fill #0

## 2023-10-07 MED ORDER — WEGOVY 1 MG/0.5ML ~~LOC~~ SOAJ
1.0000 mg | SUBCUTANEOUS | 1 refills | Status: DC
  Filled 2023-10-07 (×2): qty 4, 28d supply, fill #0
  Filled 2023-10-13 – 2023-10-18 (×2): qty 2, 28d supply, fill #0

## 2023-10-13 ENCOUNTER — Other Ambulatory Visit (HOSPITAL_COMMUNITY): Payer: Self-pay

## 2023-10-18 ENCOUNTER — Other Ambulatory Visit (HOSPITAL_COMMUNITY): Payer: Self-pay

## 2023-10-19 ENCOUNTER — Other Ambulatory Visit (HOSPITAL_COMMUNITY): Payer: Self-pay

## 2024-03-09 ENCOUNTER — Other Ambulatory Visit (HOSPITAL_COMMUNITY): Payer: Self-pay

## 2024-03-09 MED ORDER — PHENTERMINE HCL 15 MG PO CAPS
15.0000 mg | ORAL_CAPSULE | Freq: Every morning | ORAL | 0 refills | Status: DC
  Filled 2024-03-09: qty 90, 90d supply, fill #0

## 2024-03-13 ENCOUNTER — Other Ambulatory Visit (HOSPITAL_COMMUNITY): Payer: Self-pay

## 2024-05-31 ENCOUNTER — Other Ambulatory Visit (HOSPITAL_COMMUNITY): Payer: Self-pay

## 2024-05-31 MED ORDER — PHENTERMINE HCL 37.5 MG PO TABS
37.5000 mg | ORAL_TABLET | Freq: Every day | ORAL | 0 refills | Status: DC
  Filled 2024-05-31: qty 30, 30d supply, fill #0

## 2024-06-16 ENCOUNTER — Other Ambulatory Visit (HOSPITAL_COMMUNITY): Payer: Self-pay

## 2024-06-21 ENCOUNTER — Other Ambulatory Visit (HOSPITAL_COMMUNITY): Payer: Self-pay

## 2024-06-21 ENCOUNTER — Encounter (HOSPITAL_COMMUNITY): Payer: Self-pay

## 2024-06-28 ENCOUNTER — Other Ambulatory Visit (HOSPITAL_COMMUNITY): Payer: Self-pay

## 2024-06-28 MED ORDER — ZEPBOUND 2.5 MG/0.5ML ~~LOC~~ SOAJ
2.5000 mg | SUBCUTANEOUS | 2 refills | Status: AC
  Filled 2024-06-28: qty 2, 28d supply, fill #0
  Filled 2024-07-05: qty 2, 28d supply, fill #1
  Filled 2024-07-13: qty 2, 28d supply, fill #0

## 2024-07-05 ENCOUNTER — Other Ambulatory Visit (HOSPITAL_COMMUNITY): Payer: Self-pay

## 2024-07-11 ENCOUNTER — Other Ambulatory Visit (HOSPITAL_COMMUNITY): Payer: Self-pay

## 2024-07-13 ENCOUNTER — Other Ambulatory Visit (HOSPITAL_COMMUNITY): Payer: Self-pay
